# Patient Record
Sex: Female | Born: 1945 | Race: Black or African American | Hispanic: No | Marital: Married | Smoking: Never smoker
Health system: Southern US, Community
[De-identification: ages and names within clinical notes are randomized; demographics above are authoritative.]

## PROBLEM LIST (undated history)

## (undated) DIAGNOSIS — M8580 Other specified disorders of bone density and structure, unspecified site: Secondary | ICD-10-CM

## (undated) DIAGNOSIS — I10 Essential (primary) hypertension: Secondary | ICD-10-CM

## (undated) DIAGNOSIS — R739 Hyperglycemia, unspecified: Secondary | ICD-10-CM

## (undated) DIAGNOSIS — K5730 Diverticulosis of large intestine without perforation or abscess without bleeding: Secondary | ICD-10-CM

## (undated) DIAGNOSIS — E785 Hyperlipidemia, unspecified: Secondary | ICD-10-CM

## (undated) DIAGNOSIS — D649 Anemia, unspecified: Secondary | ICD-10-CM

## (undated) DIAGNOSIS — H9319 Tinnitus, unspecified ear: Secondary | ICD-10-CM

## (undated) DIAGNOSIS — H269 Unspecified cataract: Secondary | ICD-10-CM

## (undated) DIAGNOSIS — T7840XA Allergy, unspecified, initial encounter: Secondary | ICD-10-CM

## (undated) HISTORY — DX: Essential (primary) hypertension: I10

## (undated) HISTORY — PX: CATARACT EXTRACTION, BILATERAL: SHX1313

## (undated) HISTORY — DX: Tinnitus, unspecified ear: H93.19

## (undated) HISTORY — DX: Hyperglycemia, unspecified: R73.9

## (undated) HISTORY — DX: Anemia, unspecified: D64.9

## (undated) HISTORY — DX: Other specified disorders of bone density and structure, unspecified site: M85.80

## (undated) HISTORY — PX: OOPHORECTOMY: SHX86

## (undated) HISTORY — DX: Unspecified cataract: H26.9

## (undated) HISTORY — DX: Diverticulosis of large intestine without perforation or abscess without bleeding: K57.30

## (undated) HISTORY — PX: COLONOSCOPY: SHX174

## (undated) HISTORY — DX: Allergy, unspecified, initial encounter: T78.40XA

## (undated) HISTORY — DX: Hyperlipidemia, unspecified: E78.5

---

## 1997-05-19 HISTORY — PX: ABDOMINAL HYSTERECTOMY: SHX81

## 1999-07-16 ENCOUNTER — Other Ambulatory Visit: Admission: RE | Admit: 1999-07-16 | Discharge: 1999-07-16 | Payer: Self-pay | Admitting: Family Medicine

## 2001-12-17 ENCOUNTER — Other Ambulatory Visit: Admission: RE | Admit: 2001-12-17 | Discharge: 2001-12-17 | Payer: Self-pay | Admitting: Family Medicine

## 2001-12-17 ENCOUNTER — Encounter: Payer: Self-pay | Admitting: Family Medicine

## 2001-12-17 LAB — CONVERTED CEMR LAB: Pap Smear: NORMAL

## 2003-02-12 ENCOUNTER — Encounter: Payer: Self-pay | Admitting: Family Medicine

## 2003-02-12 ENCOUNTER — Ambulatory Visit (HOSPITAL_COMMUNITY): Admission: RE | Admit: 2003-02-12 | Discharge: 2003-02-12 | Payer: Self-pay | Admitting: Family Medicine

## 2003-02-17 HISTORY — PX: CEREBRAL ANEURYSM REPAIR: SHX164

## 2004-03-05 ENCOUNTER — Ambulatory Visit: Payer: Self-pay | Admitting: Family Medicine

## 2004-06-03 ENCOUNTER — Ambulatory Visit: Payer: Self-pay | Admitting: Family Medicine

## 2005-03-10 ENCOUNTER — Ambulatory Visit: Payer: Self-pay | Admitting: Family Medicine

## 2005-08-03 ENCOUNTER — Encounter: Payer: Self-pay | Admitting: Family Medicine

## 2005-08-08 ENCOUNTER — Ambulatory Visit: Payer: Self-pay | Admitting: Family Medicine

## 2005-08-19 ENCOUNTER — Ambulatory Visit: Payer: Self-pay | Admitting: Family Medicine

## 2006-04-07 ENCOUNTER — Ambulatory Visit: Payer: Self-pay | Admitting: Family Medicine

## 2006-10-22 ENCOUNTER — Encounter (INDEPENDENT_AMBULATORY_CARE_PROVIDER_SITE_OTHER): Payer: Self-pay | Admitting: Internal Medicine

## 2006-10-22 ENCOUNTER — Ambulatory Visit: Payer: Self-pay | Admitting: Family Medicine

## 2006-10-27 LAB — CONVERTED CEMR LAB
CO2: 30 meq/L (ref 19–32)
Chloride: 104 meq/L (ref 96–112)
Creatinine, Ser: 0.7 mg/dL (ref 0.4–1.2)
Potassium: 4.1 meq/L (ref 3.5–5.1)
Sodium: 140 meq/L (ref 135–145)

## 2006-10-28 ENCOUNTER — Encounter: Payer: Self-pay | Admitting: Family Medicine

## 2006-10-28 DIAGNOSIS — H9319 Tinnitus, unspecified ear: Secondary | ICD-10-CM | POA: Insufficient documentation

## 2006-10-28 DIAGNOSIS — K5730 Diverticulosis of large intestine without perforation or abscess without bleeding: Secondary | ICD-10-CM | POA: Insufficient documentation

## 2006-10-30 ENCOUNTER — Ambulatory Visit: Payer: Self-pay | Admitting: Family Medicine

## 2006-10-30 DIAGNOSIS — F411 Generalized anxiety disorder: Secondary | ICD-10-CM | POA: Insufficient documentation

## 2006-12-04 ENCOUNTER — Ambulatory Visit: Payer: Self-pay | Admitting: Family Medicine

## 2007-02-01 ENCOUNTER — Ambulatory Visit: Payer: Self-pay | Admitting: Family Medicine

## 2007-03-09 ENCOUNTER — Telehealth (INDEPENDENT_AMBULATORY_CARE_PROVIDER_SITE_OTHER): Payer: Self-pay | Admitting: *Deleted

## 2007-04-08 ENCOUNTER — Ambulatory Visit: Payer: Self-pay | Admitting: Family Medicine

## 2007-04-08 ENCOUNTER — Encounter: Payer: Self-pay | Admitting: Family Medicine

## 2007-04-13 ENCOUNTER — Encounter (INDEPENDENT_AMBULATORY_CARE_PROVIDER_SITE_OTHER): Payer: Self-pay | Admitting: *Deleted

## 2007-10-01 ENCOUNTER — Ambulatory Visit: Payer: Self-pay | Admitting: Family Medicine

## 2007-10-01 ENCOUNTER — Encounter: Payer: Self-pay | Admitting: Family Medicine

## 2007-10-01 ENCOUNTER — Other Ambulatory Visit: Admission: RE | Admit: 2007-10-01 | Discharge: 2007-10-01 | Payer: Self-pay | Admitting: Family Medicine

## 2007-10-01 DIAGNOSIS — N951 Menopausal and female climacteric states: Secondary | ICD-10-CM | POA: Insufficient documentation

## 2007-10-01 LAB — CONVERTED CEMR LAB: KOH Prep: NEGATIVE

## 2007-10-04 LAB — CONVERTED CEMR LAB
Albumin: 4.4 g/dL (ref 3.5–5.2)
Alkaline Phosphatase: 80 units/L (ref 39–117)
BUN: 12 mg/dL (ref 6–23)
Basophils Absolute: 0 10*3/uL (ref 0.0–0.1)
Basophils Relative: 0 % (ref 0–1)
Calcium: 9.1 mg/dL (ref 8.4–10.5)
Cholesterol: 207 mg/dL — ABNORMAL HIGH (ref 0–200)
Eosinophils Absolute: 0.1 10*3/uL (ref 0.0–0.7)
HDL: 54 mg/dL (ref >39)
Hemoglobin: 12.6 g/dL (ref 12.0–15.0)
Lymphocytes Relative: 29 % (ref 12–46)
Lymphs Abs: 2.1 10*3/uL (ref 0.7–4.0)
MCHC: 32.3 g/dL (ref 30.0–36.0)
MCV: 81.4 fL (ref 78.0–100.0)
Monocytes Absolute: 0.5 10*3/uL (ref 0.1–1.0)
Neutro Abs: 4.4 10*3/uL (ref 1.7–7.7)
Potassium: 4.8 meq/L (ref 3.5–5.3)
RBC: 4.79 M/uL (ref 3.87–5.11)
Total Bilirubin: 0.4 mg/dL (ref 0.3–1.2)
VLDL: 26 mg/dL (ref 0–40)
WBC: 7.1 10*3/uL (ref 4.0–10.5)

## 2007-10-08 ENCOUNTER — Encounter (INDEPENDENT_AMBULATORY_CARE_PROVIDER_SITE_OTHER): Payer: Self-pay | Admitting: *Deleted

## 2007-10-20 ENCOUNTER — Ambulatory Visit: Payer: Self-pay | Admitting: Gastroenterology

## 2007-11-03 ENCOUNTER — Ambulatory Visit: Payer: Self-pay | Admitting: Gastroenterology

## 2007-11-03 LAB — HM COLONOSCOPY

## 2008-04-04 ENCOUNTER — Ambulatory Visit: Payer: Self-pay | Admitting: Family Medicine

## 2008-04-04 LAB — CONVERTED CEMR LAB: Anti Nuclear Antibody(ANA): NEGATIVE

## 2008-04-05 LAB — CONVERTED CEMR LAB
Rhuematoid fact SerPl-aCnc: <20 intl units/mL — ABNORMAL LOW (ref 0.0–20.0)
Sed Rate: 24 mm/hr — ABNORMAL HIGH (ref 0–22)

## 2008-04-11 ENCOUNTER — Ambulatory Visit: Payer: Self-pay | Admitting: Family Medicine

## 2008-04-11 ENCOUNTER — Encounter: Payer: Self-pay | Admitting: Family Medicine

## 2008-11-10 ENCOUNTER — Ambulatory Visit: Payer: Self-pay | Admitting: Family Medicine

## 2009-02-15 ENCOUNTER — Ambulatory Visit: Payer: Self-pay | Admitting: Family Medicine

## 2009-02-15 DIAGNOSIS — J309 Allergic rhinitis, unspecified: Secondary | ICD-10-CM

## 2009-04-29 ENCOUNTER — Encounter: Payer: Self-pay | Admitting: Family Medicine

## 2009-04-30 ENCOUNTER — Ambulatory Visit: Payer: Self-pay | Admitting: Family Medicine

## 2009-04-30 DIAGNOSIS — Z780 Asymptomatic menopausal state: Secondary | ICD-10-CM | POA: Insufficient documentation

## 2009-05-01 LAB — CONVERTED CEMR LAB
ALT: 12 units/L (ref 0–35)
Albumin: 3.8 g/dL (ref 3.5–5.2)
Basophils Relative: 0.8 % (ref 0.0–3.0)
Bilirubin, Direct: 0 mg/dL (ref 0.0–0.3)
CO2: 29 meq/L (ref 19–32)
Calcium: 9.1 mg/dL (ref 8.4–10.5)
Eosinophils Relative: 1.7 % (ref 0.0–5.0)
HCT: 36.9 % (ref 36.0–46.0)
Hemoglobin: 12.2 g/dL (ref 12.0–15.0)
Lymphs Abs: 1.5 10*3/uL (ref 0.7–4.0)
MCV: 81.8 fL (ref 78.0–100.0)
Microalb, Ur: 1.4 mg/dL (ref 0.0–1.9)
Monocytes Absolute: 0.4 10*3/uL (ref 0.1–1.0)
Neutro Abs: 2.7 10*3/uL (ref 1.4–7.7)
Neutrophils Relative %: 57.5 % (ref 43.0–77.0)
Potassium: 4.1 meq/L (ref 3.5–5.1)
RBC: 4.51 M/uL (ref 3.87–5.11)
Sodium: 144 meq/L (ref 135–145)
TSH: 0.79 microintl units/mL (ref 0.35–5.50)
Total Protein: 7 g/dL (ref 6.0–8.3)
WBC: 4.7 10*3/uL (ref 4.5–10.5)

## 2009-06-06 ENCOUNTER — Ambulatory Visit: Payer: Self-pay | Admitting: Family Medicine

## 2009-06-06 ENCOUNTER — Encounter: Payer: Self-pay | Admitting: Family Medicine

## 2009-06-13 ENCOUNTER — Encounter (INDEPENDENT_AMBULATORY_CARE_PROVIDER_SITE_OTHER): Payer: Self-pay | Admitting: *Deleted

## 2009-06-13 LAB — HM MAMMOGRAPHY: HM Mammogram: NORMAL

## 2009-07-31 ENCOUNTER — Encounter (INDEPENDENT_AMBULATORY_CARE_PROVIDER_SITE_OTHER): Payer: Self-pay | Admitting: *Deleted

## 2009-08-07 ENCOUNTER — Ambulatory Visit: Payer: Self-pay | Admitting: Family Medicine

## 2009-08-07 DIAGNOSIS — E7800 Pure hypercholesterolemia, unspecified: Secondary | ICD-10-CM | POA: Insufficient documentation

## 2009-08-07 LAB — CONVERTED CEMR LAB
AST: 19 units/L (ref 0–37)
Cholesterol: 191 mg/dL (ref 0–200)
LDL Cholesterol: 109 mg/dL — ABNORMAL HIGH (ref 0–99)
VLDL: 28 mg/dL (ref 0.0–40.0)

## 2009-08-08 ENCOUNTER — Ambulatory Visit: Payer: Self-pay | Admitting: Family Medicine

## 2010-01-30 ENCOUNTER — Ambulatory Visit: Payer: Self-pay | Admitting: Family Medicine

## 2010-01-30 LAB — CONVERTED CEMR LAB
ALT: 16 units/L (ref 0–35)
AST: 22 units/L (ref 0–37)
LDL Cholesterol: 103 mg/dL — ABNORMAL HIGH (ref 0–99)
VLDL: 14.8 mg/dL (ref 0.0–40.0)

## 2010-02-06 ENCOUNTER — Ambulatory Visit: Payer: Self-pay | Admitting: Family Medicine

## 2010-02-06 DIAGNOSIS — R7303 Prediabetes: Secondary | ICD-10-CM

## 2010-05-01 ENCOUNTER — Ambulatory Visit: Payer: Self-pay | Admitting: Family Medicine

## 2010-05-04 LAB — CONVERTED CEMR LAB
ALT: 15 units/L (ref 0–35)
AST: 18 units/L (ref 0–37)
Albumin: 3.7 g/dL (ref 3.5–5.2)
BUN: 13 mg/dL (ref 6–23)
CO2: 30 meq/L (ref 19–32)
Calcium: 9.3 mg/dL (ref 8.4–10.5)
Chloride: 108 meq/L (ref 96–112)
Cholesterol: 200 mg/dL (ref 0–200)
Creatinine, Ser: 0.8 mg/dL (ref 0.4–1.2)
GFR calc non Af Amer: 94.9 mL/min (ref >60.00)
Glucose, Bld: 91 mg/dL (ref 70–99)
HDL: 50.9 mg/dL (ref >39.00)
Hgb A1c MFr Bld: 6.2 % (ref 4.6–6.5)
LDL Cholesterol: 126 mg/dL — ABNORMAL HIGH (ref 0–99)
Phosphorus: 3.6 mg/dL (ref 2.3–4.6)
Potassium: 4.1 meq/L (ref 3.5–5.1)
Sodium: 143 meq/L (ref 135–145)
Total CHOL/HDL Ratio: 4
Triglycerides: 115 mg/dL (ref 0.0–149.0)
VLDL: 23 mg/dL (ref 0.0–40.0)

## 2010-05-08 ENCOUNTER — Ambulatory Visit: Payer: Self-pay | Admitting: Family Medicine

## 2010-06-07 ENCOUNTER — Encounter: Payer: Self-pay | Admitting: Family Medicine

## 2010-06-07 ENCOUNTER — Ambulatory Visit: Payer: Self-pay | Admitting: Family Medicine

## 2010-06-10 ENCOUNTER — Ambulatory Visit
Admission: RE | Admit: 2010-06-10 | Discharge: 2010-06-10 | Payer: Self-pay | Source: Home / Self Care | Attending: Family Medicine | Admitting: Family Medicine

## 2010-06-10 ENCOUNTER — Other Ambulatory Visit: Payer: Self-pay | Admitting: Family Medicine

## 2010-06-10 LAB — LIPID PANEL
Cholesterol: 139 mg/dL (ref 0–200)
HDL: 51.9 mg/dL (ref >39.00)
Triglycerides: 84 mg/dL (ref 0.0–149.0)

## 2010-06-10 LAB — ALT: ALT: 18 U/L (ref 0–35)

## 2010-06-10 LAB — AST: AST: 22 U/L (ref 0–37)

## 2010-06-11 ENCOUNTER — Encounter: Payer: Self-pay | Admitting: Family Medicine

## 2010-06-17 ENCOUNTER — Ambulatory Visit: Payer: Self-pay | Admitting: Family Medicine

## 2010-06-17 ENCOUNTER — Encounter: Payer: Self-pay | Admitting: Family Medicine

## 2010-06-17 DIAGNOSIS — N63 Unspecified lump in unspecified breast: Secondary | ICD-10-CM | POA: Insufficient documentation

## 2010-06-18 NOTE — Miscellaneous (Signed)
Summary: mammogram results  Clinical Lists Changes  Observations: Added new observation of MAMMO DUE: 06/2010 (06/13/2009 8:09) Added new observation of MAMMOGRAM: normal (06/13/2009 8:09)      Preventive Care Screening  Mammogram:    Date:  06/13/2009    Next Due:  06/2010    Results:  normal

## 2010-06-18 NOTE — Assessment & Plan Note (Signed)
Summary: ROA FOR 6 MONTH FOLLOW-UP/JRR   Vital Signs:  Patient profile:   65 year old female Weight:      167.4 pounds Temp:     97.8 degrees F oral Pulse rate:   64 / minute Pulse rhythm:   regular BP sitting:   112 / 68  (left arm) Cuff size:   large  Vitals Entered By: Sydell Axon LPN (February 06, 2010 8:06 AM) CC: 6 Month follow-up   History of Present Illness: here for f/u of high cholesterol and hyperglycemia  wt is down 3 lb  cholesterol is fairly stable with trig of 74 and HDL 48 and LDL 103 (down from 109 and trig down from 140) was put on 1/2 zocor  not much different  has not been eating the best   started going to [employer] and going 5 days per week  wt is coming down very slowly  loves it  this helps her stress a bit too -- feels less wound up   stress level is down a little   does not get flu shots  flu shot made her sick  she does visit nursing homes   feet swelling at time        Allergies: 1)  ! 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) 6/09 colonoscopy- hemorrhoids   Family History: Last updated: 10/01/2007 father  DM Mother: CHF, HTN, DM Siblings:  brother DM, CVA and MI--at 45  Social History: Last updated: 02/06/2010 Marital Status: Married Children: 2 sons Occupation: Social research officer, government-- retired  much stress- husband [name]) has schitzoaffective disorder goes to [employer] for exercise  Risk Factors: Smoking Status: never (10/22/2006)  Past Medical History: hyperglycemia  Diverticulosis, colon inc intercular pressure osteopenia  hyperlipidemia     opthy -- Dr Orson Slick  Social History: Marital Status: Married Children: 2 sons Occupation: Social research officer, government-- retired  much stress- husband [name]) has schitzoaffective  disorder goes to [employer] for exercise  Review of Systems General:  Denies fatigue, loss of appetite, and malaise. Eyes:  Denies blurring and eye irritation. CV:  Complains of swelling of feet; denies chest pain or discomfort, lightheadness, palpitations, and shortness of breath with exertion. Resp:  Denies cough, shortness of breath, and wheezing. GI:  Denies abdominal pain, change in bowel habits, and indigestion. GU:  Denies dysuria and urinary frequency. MS:  Denies joint pain, muscle aches, and cramps. Derm:  Denies itching, lesion(s), poor wound healing, and rash. Neuro:  Denies numbness and tingling. Psych:  Denies anxiety and depression. Endo:  Denies cold intolerance, excessive thirst, excessive urination, and heat intolerance. Heme:  Denies abnormal bruising and bleeding.  Physical Exam  General:  overweight but generally well appearing  Head:  normocephalic, atraumatic, and no abnormalities observed.   Eyes:  vision grossly intact, pupils equal, pupils round, and pupils reactive to light.  no conjunctival pallor, injection or icterus  Mouth:  pharynx pink and moist.   Neck:  supple with full rom and no masses or thyromegally, no JVD or carotid bruit  Lungs:  Normal respiratory effort, chest expands symmetrically. Lungs are clear to auscultation, no crackles or wheezes. Heart:  Normal rate and regular rhythm. S1 and S2 normal without gallop, murmur, click, rub or other extra sounds. Abdomen:  Bowel sounds positive,abdomen soft and non-tender without masses, organomegaly or hernias noted.  no renal bruits  Pulses:  R and L carotid,radial,femoral,dorsalis pedis and posterior tibial pulses are full and equal bilaterally Extremities:  No clubbing, cyanosis, edema, or deformity noted with normal full range of motion of all joints.   Neurologic:  sensation intact to light touch, gait normal, and DTRs symmetrical and normal.   Skin:  Intact without suspicious lesions or rashes Cervical  Nodes:  No lymphadenopathy noted Psych:  normal affect, talkative and pleasant   Diabetes Management Exam:    Foot Exam (with socks and/or shoes not present):       Sensory-Pinprick/Light touch:          Left medial foot (L-4): normal          Left dorsal foot (L-5): normal          Left lateral foot (S-1): normal          Right medial foot (L-4): normal          Right dorsal foot (L-5): normal          Right lateral foot (S-1): normal       Sensory-Monofilament:          Left foot: normal          Right foot: normal       Inspection:          Left foot: normal          Right foot: normal       Nails:          Left foot: normal          Right foot: normal   Impression & Recommendations:  Problem # 1:  HYPERCHOLESTEROLEMIA (ICD-272.0) Assessment Unchanged  this did not change with low dose zocor  will hold it for now and we disc low sat fat diet in detail lab and f/u 3 mo -- if not staying at goal will disc going up on dose or trying something different  The following medications were removed from the medication list:    Zocor 20 Mg Tabs (Simvastatin) .Marland Kitchen... 1/2 by mouth once daily in evening  Labs Reviewed: SGOT: 22 (01/30/2010)   SGPT: 16 (01/30/2010)   HDL:48.40 (01/30/2010), 54.30 (08/07/2009)  LDL:103 (01/30/2010), 109 (08/07/2009)  Chol:166 (01/30/2010), 191 (08/07/2009)  Trig:74.0 (01/30/2010), 140.0 (08/07/2009)  Problem # 2:  HYPERGLYCEMIA (ICD-790.29) Assessment: Unchanged  last AIC 6  pt is making some effort with diet but still eating too many sweets disc low glycemic diet in detail  urged to keep working on wt loss and great exercise I recommended flu shot and pt declined it  check AIC 3 mo and then f/u  Labs Reviewed: Creat: 0.7 (04/30/2009)     Last Eye Exam: normal (04/18/2009)  Complete Medication List: 1)  Claritin 10 Mg Tabs (Loratadine) .Marland Kitchen.. 1-2 once daily as needed congestion 2)  Travatan Z 0.004 % Soln (Travoprost) .... Use one drop in left  eye at bedtime 3)  Aspir-low 81 Mg Tbec (Aspirin) .Marland Kitchen.. 1 by mouth once daily  Patient Instructions: 1)  please ask your husband not to eat junk food in front of you  2)  do not fall into his trap of eating  3)  please stick with low sugar and low fat diet  4)  keep up the great exercise  5)  stop the zocor for now - it is not really working  6)  schedule fasting labs in 3 months lipid/ast/alt / renal and AIC 250.0 and 272  and then follow up   Current Allergies (reviewed today): ! * PEXEVA

## 2010-06-18 NOTE — Assessment & Plan Note (Signed)
Summary: 3 MONTH FOLLOW UP-DISCUSS LABS/lb   Vital Signs:  Patient profile:   65 year old female Height:      63.25 inches Weight:      170 pounds BMI:     29.98 Temp:     98.3 degrees F oral Pulse rate:   76 / minute Pulse rhythm:   regular BP sitting:   114 / 72  (left arm) Cuff size:   regular  Vitals Entered By: Lewanda Rife LPN (August 08, 2009 10:29 AM) CC: three month follow up to discuss labs   History of Present Illness: here for f/u of lipids  recent lab showed imp with trig 140/ HDL 54 and LDL 109-- this is down from 130s   has been working on diet  has been eating oatmeal -- and lots of vegetables trying to stay away from fatty foods  eating mainly chicken and fish  not often bkfast meats or eggs  high fiber cereal - with lactaid low fat   not quite at goal but improved  high chol does run in family   is trying to exercise -signed up for [employer]  still really stressed out     bp is great   Allergies: 1)  ! 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 Eyes:  Denies blurring and eye irritation. CV:  Denies chest pain or discomfort, lightheadness, and palpitations. Resp:  Denies cough, shortness of breath, and wheezing. GI:  Denies abdominal pain, bloody stools, change in bowel habits, and indigestion. MS:  Denies muscle aches and muscle weakness. Derm:  Denies itching, lesion(s), poor wound healing, and rash. Neuro:  Denies numbness and tingling. Psych:  Denies anxiety and depression. Endo:  Denies excessive thirst and excessive urination. Heme:  Denies abnormal bruising and bleeding.  Physical Exam  General:  overweight but generally well appearing  Head:  normocephalic, atraumatic, and no abnormalities observed.   Eyes:  vision grossly intact, pupils equal, pupils round, and pupils reactive to light.  no conjunctival pallor, injection or icterus  Neck:  supple with full rom and no masses or thyromegally, no JVD or carotid bruit  Lungs:  Normal respiratory effort, chest expands symmetrically. Lungs are clear to auscultation, no crackles or wheezes. Heart:  Normal rate and regular rhythm. S1 and S2 normal without gallop, murmur, click, rub or other extra sounds. Pulses:  R and L carotid,radial,femoral,dorsalis pedis and posterior tibial pulses are full and equal bilaterally Extremities:  No clubbing, cyanosis, edema, or deformity noted with normal full range of motion of all joints.   Neurologic:  sensation intact to light touch, gait normal, and DTRs symmetrical and normal.   Skin:  Intact without  suspicious lesions or rashes Cervical Nodes:  No lymphadenopathy noted Psych:  normal affect, talkative and pleasant   Diabetes Management Exam:    Foot Exam (with socks and/or shoes not present):       Sensory-Pinprick/Light touch:          Left medial foot (L-4): normal          Left dorsal foot (L-5): normal          Left lateral foot (S-1): normal          Right medial foot (L-4):  normal          Right dorsal foot (L-5): normal          Right lateral foot (S-1): normal       Sensory-Monofilament:          Left foot: normal          Right foot: normal       Inspection:          Left foot: normal          Right foot: normal       Nails:          Left foot: normal          Right foot: normal   Impression & Recommendations:  Problem # 1:  HYPERCHOLESTEROLEMIA (ICD-272.0) Assessment Improved  improved with better diet but not at goal for diabetic  will start low dose zocor and re check 6 wk disc poss side eff urged to keep up good diet f/u 6 mo  Her updated medication list for this problem includes:    Zocor 20 Mg Tabs (Simvastatin) .Marland Kitchen... 1/2 by mouth once daily in evening  Labs Reviewed: SGOT: 19 (08/07/2009)   SGPT: 13 (08/07/2009)   HDL:54.30 (08/07/2009), 50.90 (04/30/2009)  LDL:109 (08/07/2009), 127 (10/01/2007)  Chol:191 (08/07/2009), 203 (04/30/2009)  Trig:140.0 (08/07/2009), 88.0 (04/30/2009)  Orders: Prescription Created Electronically [phone])  Complete Medication List: 1)  Claritin 10 Mg Tabs (Loratadine) .Marland Kitchen.. 1-2 once daily as needed congestion 2)  Travatan Z 0.004 % Soln (Travoprost) .... Use one drop in left eye at bedtime 3)  Aspir-low 81 Mg Tbec (Aspirin) .Marland Kitchen.. 1 by mouth once daily 4)  Zocor 20 Mg Tabs (Simvastatin) .... 1/2 by mouth once daily in evening  Patient Instructions: 1)  start zocor 1/2 pill daily in evening 2)  keep up the great low sat fat diet  3)  schedule fasting labs in 6 weeks lipid/ast/alt 272  4)  follow up with me in 6 months  Prescriptions: ZOCOR 20 MG TABS (SIMVASTATIN) 1/2 by mouth once daily in evening  #15 x 11   Entered and Authorized by:   Judith Part MD   Signed by:   Judith Part MD on 08/08/2009   Method used:   [facility] to        [facility]  [address] (retail)       [address]       [place], [place]  [address]       Ph: [phone] or [phone]       Fax: [phone]    RxID:   [id number]   Current Allergies (reviewed today): ! * PEXEVA

## 2010-06-18 NOTE — Letter (Signed)
Summary: [facility] No Show Letter  [place] [facility]  [address] [place], [place] [address]   Phone: [phone]  Fax: [phone]    07/31/2009 MRN: [id number]  Sue Conway [address] [place], [place]  [address]   Dear Ms. Smeltz,   Our records indicate that you missed your scheduled appointment with ____lab_________________ on __3.15.11__________.  Please contact this office to reschedule your appointment as soon as possible.  It is important that you keep your scheduled appointments with your physician, so we can provide you the best care possible.  Please be advised that there may be a charge for "no show" appointments.    Sincerely,   [place] [facility]

## 2010-06-19 HISTORY — PX: FINE NEEDLE ASPIRATION: SHX406

## 2010-06-20 NOTE — Assessment & Plan Note (Signed)
Summary: FOLLOW UP AFTER LABS/RI   Vital Signs:  Patient profile:   65 year old female Height:      63.25 inches Weight:      163.25 pounds BMI:     28.79 Temp:     98.2 degrees F oral Pulse rate:   64 / minute Pulse rhythm:   regular BP sitting:   118 / 72  (left arm) Cuff size:   large  Vitals Entered By: Lewanda Rife LPN (May 08, 2010 8:57 AM) CC: follow-up visit after labs   History of Present Illness: here for f/u of lipids and hyperglycemia - both diet controlled   has been feeling pretty good overall no medical changes  is trying to take care of herself   wt is down 4 lb-- is good news  diet-- is eating less and more high fiber foods and veg  is getting some exercise -- goes to [employer]   lipids are up off zocor with trig 115 and HDL 50 and LDL 126 up from 103  AIC is 6.2 up from 6  needs calcium and vit D for bones  tried [facility] -- and had side eff?     Allergies: 1)  ! 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 Eyes:  Denies blurring and eye irritation. CV:  Denies chest pain or discomfort,  lightheadness, palpitations, and shortness of breath with exertion. Resp:  Denies cough, shortness of breath, and wheezing. GI:  Denies abdominal pain, bloody stools, change in bowel habits, indigestion, and nausea. GU:  Denies dysuria and urinary hesitancy. MS:  Denies muscle aches and cramps. Derm:  Denies itching, lesion(s), poor wound healing, and rash. Neuro:  Denies numbness and tingling. Psych:  mood is ok . Endo:  Denies cold intolerance, excessive thirst, excessive urination, and heat intolerance. Heme:  Denies abnormal bruising and bleeding.  Physical Exam  General:  overweight but generally well appearing  Head:  normocephalic, atraumatic, and no abnormalities observed.   Eyes:  vision grossly intact, pupils equal, pupils round, and pupils reactive to light.  no conjunctival pallor, injection or icterus  Mouth:  pharynx pink and moist.   Neck:  supple with full rom and no masses or thyromegally, no JVD or carotid bruit  Chest Wall:  No deformities, masses, or tenderness noted. Lungs:  Normal respiratory effort, chest expands symmetrically. Lungs are clear to auscultation, no crackles or wheezes. Heart:  Normal rate and regular rhythm. S1 and S2 normal without gallop, murmur, click, rub or other extra sounds. Abdomen:  Bowel sounds positive,abdomen soft and non-tender without masses, organomegaly or hernias noted. no renal bruits  Msk:  No deformity or scoliosis noted of thoracic or lumbar spine.  no acute joint changes  nl rom L arm- no tenderness  Extremities:  No clubbing, cyanosis, edema, or deformity noted with normal full range of motion of all joints.   Neurologic:  sensation intact to pinprick, gait normal, and DTRs symmetrical and normal.   Skin:  Intact without suspicious lesions or rashes Cervical Nodes:  No lymphadenopathy noted Inguinal Nodes:  No significant adenopathy Psych:  normal affect, talkative and pleasant    Impression & Recommendations:  Problem #  1:  HYPERCHOLESTEROLEMIA (ICD-272.0) Assessment Deteriorated  this is up off zocor  pt is worried about side eff given fam hx and hyperglycemia my goal for LDL is 100 or less and diet is already great rev low sat fat diet  trial of lipitor and update lab 6 wk Her updated medication list for this problem includes:    Lipitor 10 Mg Tabs (Atorvastatin calcium) .Marland Kitchen... 1 by mouth once daily in evening  Labs Reviewed: SGOT: 18 (05/01/2010)   SGPT: 15 (05/01/2010)   HDL:50.90 (05/01/2010), 48.40 (01/30/2010)  LDL:126 (05/01/2010), 103 (01/30/2010)  Chol:200 (05/01/2010), 166 (01/30/2010)  Trig:115.0 (05/01/2010), 74.0 (01/30/2010)  Orders: Prescription Created Electronically [phone])  Problem # 2:  HYPERGLYCEMIA (ICD-790.29) Assessment: Deteriorated  this is stable to slt worse disc avoiding simple sugars and carbs  enc to get back to [facility] work on wt loss risk for DM is high given fam hx  plan on PE / labs in 6 mo  Labs Reviewed: Creat: 0.8 (05/01/2010)     Last Eye Exam: normal (04/18/2009)  Problem # 3:  OTHER SCREENING MAMMOGRAM (ICD-V76.12) Assessment: Comment Only schedule annual mam due next mo  f/u PE in 6 mo Orders: Radiology Referral (Radiology)  Complete Medication List: 1)  Claritin 10 Mg Tabs (Loratadine) .Marland Kitchen.. 1-2 once daily as needed congestion 2)  Lipitor 10 Mg Tabs (Atorvastatin calcium) .Marland Kitchen.. 1 by mouth once daily in evening  Patient Instructions: 1)  try viactiv (or other generic version)-- candy chewables for calcium and vitamin D  2)  the current recommendation for calcium intake is 1200-1500 mg daily with [phone] IU of vitamin D  3)  keep going to [facility] 4)  try generic lipitor -- low dose 5)  if any side effects or problems- stop med and let me know  6)  keep up the good work with healthy diet  7)  fasting labs for 6 weeks lipid/ast/alt 272  8)  schedule PE in 6 months please 9)  we will schedule mammogram at check out  Prescriptions: LIPITOR 10  MG TABS (ATORVASTATIN CALCIUM) 1 by mouth once daily in evening  #30 x 11   Entered and Authorized by:   Judith Part MD   Signed by:   Judith Part MD on 05/08/2010   Method used:   [facility]        [facility] ([facility]ail)       [address]       [place], [place]  [address]       Ph: [phone] or [phone]       Fax: [phone]   RxID:   [id number]    Orders Added: 1)  Radiology Referral [Radiology] 2)  Prescription Created Electronically [[id number]] 3)  Est. Patient Level IV [66440]    Current Allergies (reviewed today): ! * PEXEVA ASPIRIN

## 2010-06-21 ENCOUNTER — Ambulatory Visit: Payer: Self-pay

## 2010-06-21 ENCOUNTER — Ambulatory Visit: Admit: 2010-06-21 | Payer: Self-pay | Admitting: Family Medicine

## 2010-06-25 ENCOUNTER — Encounter: Payer: Self-pay | Admitting: Family Medicine

## 2010-06-26 ENCOUNTER — Encounter: Payer: Self-pay | Admitting: Family Medicine

## 2010-07-16 NOTE — Letter (Signed)
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

## 2010-07-18 ENCOUNTER — Encounter: Payer: Self-pay | Admitting: Family Medicine

## 2010-07-25 NOTE — Letter (Signed)
Summary: [facility]   Imported By: Kassie Mends 07/19/2010 11:36:24  _____________________________________________________________________  External Attachment:    Type:   Image     Comment:   External Document

## 2010-08-06 NOTE — Letter (Signed)
[facility]   Imported By: Kassie Mends 07/31/2010 09:07:35  _____________________________________________________________________  External Attachment:    Type:   Image     Comment:   External Document

## 2010-08-09 ENCOUNTER — Ambulatory Visit (HOSPITAL_COMMUNITY): Payer: Self-pay | Admitting: Psychology

## 2010-09-14 ENCOUNTER — Encounter: Payer: Self-pay | Admitting: Family Medicine

## 2010-09-14 LAB — HM MAMMOGRAPHY

## 2010-09-27 ENCOUNTER — Encounter: Payer: Self-pay | Admitting: Family Medicine

## 2010-09-27 ENCOUNTER — Ambulatory Visit (INDEPENDENT_AMBULATORY_CARE_PROVIDER_SITE_OTHER): Payer: Medicare Other | Admitting: Family Medicine

## 2010-09-27 DIAGNOSIS — F411 Generalized anxiety disorder: Secondary | ICD-10-CM

## 2010-09-27 DIAGNOSIS — E7800 Pure hypercholesterolemia, unspecified: Secondary | ICD-10-CM

## 2010-09-27 DIAGNOSIS — J309 Allergic rhinitis, unspecified: Secondary | ICD-10-CM

## 2010-09-27 DIAGNOSIS — Z23 Encounter for immunization: Secondary | ICD-10-CM

## 2010-09-27 DIAGNOSIS — N63 Unspecified lump in unspecified breast: Secondary | ICD-10-CM

## 2010-09-27 DIAGNOSIS — R7309 Other abnormal glucose: Secondary | ICD-10-CM

## 2010-09-27 LAB — LIPID PANEL
Cholesterol: 155 mg/dL (ref 0–200)
LDL Cholesterol: 76 mg/dL (ref 0–99)
Triglycerides: 84 mg/dL (ref 0.0–149.0)

## 2010-09-27 LAB — COMPREHENSIVE METABOLIC PANEL
AST: 33 U/L (ref 0–37)
Alkaline Phosphatase: 80 U/L (ref 39–117)
BUN: 9 mg/dL (ref 6–23)
Calcium: 9.7 mg/dL (ref 8.4–10.5)
Chloride: 107 mEq/L (ref 96–112)
Creatinine, Ser: 0.8 mg/dL (ref 0.4–1.2)

## 2010-09-27 LAB — HEMOGLOBIN A1C: Hgb A1c MFr Bld: 6.2 % (ref 4.6–6.5)

## 2010-09-27 MED ORDER — PNEUMOCOCCAL VAC POLYVALENT 25 MCG/0.5ML IJ INJ: 0.5000 mL | INJECTION | Freq: Once | INTRAMUSCULAR | Status: DC

## 2010-09-27 MED ORDER — FLUTICASONE PROPIONATE 50 MCG/ACT NA SUSP: 2.0000 | Freq: Every day | NASAL | Status: DC

## 2010-09-27 NOTE — Assessment & Plan Note (Signed)
Recent neg needle bx from [facility] and will re check R breast in July  I felt no abn on exam today- reassuring

## 2010-09-27 NOTE — Assessment & Plan Note (Signed)
Pt has been loosing wt on the curves program and watching sugar in diet a1c today- expect stability or improvement Enc further wt loss Rev low glycemic diet

## 2010-09-27 NOTE — Patient Instructions (Signed)
Pneumonia vaccine today Try flonase nasal spray for congestion in nose and ear through allergy season Labs today If you are interested in shingles vaccine in future - call your insurance company to see how coverage is and call us to schedule Avoid red meat/ fried foods/ egg yolks/ fatty breakfast meats/ butter, cheese and high fat dairy/ and shellfish  Also watch sugar in your diet

## 2010-09-27 NOTE — Progress Notes (Signed)
Subjective:    Patient ID: Sue Conway, female    DOB: 23-Apr-1946, 65 y.o.   MRN: [id number]  HPI Here for check up of chronic medical problems and to review health mt list  Has been feeling good overall   Had something in her eye - grass seed- removed by opthy yesterday Nl exam the week before  Is much better now   Head and R ear are stopped up  Sinus congested  Nasal d/c is all clear  Has taken some claritin  No fever or sinus pain   Zoster status- never had it - ? Sure if interested in vaccine - depends on cost and ins coverage  Pneumovax- is interested in that  Td 03  colonosc 6/09 with hem- no fam hx -- not due yet   hyst in past for fibroids No symptoms or problems , no new partners  Has not felt any breast lumps on self exam   Lipids were good in jan with HDL 51 and LDL 70 on lipitor Lab Results  Component Value Date   CHOL 139 06/10/2010   CHOL 200 05/01/2010   CHOL 166 01/30/2010   Lab Results  Component Value Date   HDL 51.90 06/10/2010   HDL 29.56 05/01/2010   HDL 48.40 01/30/2010   Lab Results  Component Value Date   LDLCALC 70 06/10/2010   LDLCALC 126* 05/01/2010   LDLCALC 103* 01/30/2010   Lab Results  Component Value Date   TRIG 84.0 06/10/2010   TRIG 115.0 05/01/2010   TRIG 74.0 01/30/2010   Lab Results  Component Value Date   CHOLHDL 3 06/10/2010   CHOLHDL 4 05/01/2010   CHOLHDL 3 01/30/2010   Lab Results  Component Value Date   LDLDIRECT 133.8 04/30/2009     Hyperglycemia Lab Results  Component Value Date   HGBA1C 6.2 05/01/2010   mammogram showed abn on R side - went back for addn views -- and then saw a surgeon -- had a breat bx and it was ok  Re check R breast in July - already schedule  ? A cyst   Past Medical History  Diagnosis Date  . Hyperlipidemia   . Elevated blood sugar level   . Diverticulosis of colon     BE mild diverticulosis 01/1995  . Osteopenia   . Ringing in ears     worse with aspirin  . Glaucoma 09/24/2005      history of glaucoma (Dr. Ranell Patrick)    History   Social History  . Marital Status: Married    Spouse Name: N/A    Number of Children: N/A  . Years of Education: N/A   Occupational History  . Not on file.   Social History Main Topics  . Smoking status: Never Smoker   . Smokeless tobacco: Not on file  . Alcohol Use: Not on file  . Drug Use: Not on file  . Sexually Active: Not on file   Other Topics Concern  . Not on file   Social History Narrative  . No narrative on file    Family History  Problem Relation Age of Onset  . Diabetes Mother   . Hypertension Mother   . Heart disease Mother     CHF  . Diabetes Father   . Diabetes Brother   . Heart disease Brother 75    CVA and MI    Allergies  Allergen Reactions  . Aspirin     REACTION: worsens her tinnitis  Review of Systems Review of Systems  Constitutional: Negative for fever, appetite change, fatigue and unexpected weight change.  Eyes: Negative for pain and visual disturbance.  ENT pos for congestion and runny nose  Respiratory: Negative for cough and shortness of breath.   Cardiovascular: Negative.   Gastrointestinal: Negative for nausea, diarrhea and constipation.  Genitourinary: Negative for urgency and frequency.  Skin: Negative for pallor.  Neurological: Negative for weakness, light-headedness, numbness and headaches.  Hematological: Negative for adenopathy. Does not bruise/bleed easily.  Psychiatric/Behavioral: Negative for dysphoric mood. The patient is not nervous/anxious.          Objective:   Physical Exam  Constitutional: She appears well-developed and well-nourished. No distress.       overwt and well app  HENT:  Head: Normocephalic and atraumatic.  Right Ear: External ear normal.  Left Ear: External ear normal.  Mouth/Throat: Oropharynx is clear and moist.       Nares are congested and boggy No sinus tenderness   Eyes: Conjunctivae and EOM are normal. Pupils are equal,  round, and reactive to light.  Neck: Normal range of motion. Neck supple. No JVD present. Carotid bruit is not present. No thyromegaly present.  Cardiovascular: Normal rate, regular rhythm and normal heart sounds.   Pulmonary/Chest: Effort normal and breath sounds normal. No respiratory distress. She has no wheezes.  Abdominal: Soft. Bowel sounds are normal. She exhibits no distension, no abdominal bruit and no mass. There is no tenderness.  Genitourinary: There is breast tenderness. No breast swelling, discharge or bleeding.       R breast is tender from recent bx  Overall no M or changes noted   Musculoskeletal: Normal range of motion. She exhibits no edema and no tenderness.  Lymphadenopathy:    She has no cervical adenopathy.  Neurological: She is alert. She has normal reflexes. Coordination normal.  Skin: Skin is warm and dry. No erythema. No pallor.  Psychiatric: She has a normal mood and affect.       Pt does seem generally stressed and tired           Assessment & Plan:

## 2010-09-27 NOTE — Assessment & Plan Note (Signed)
This has been very well controlled with lipitor- will continue that She will let me know if it becomes unaffordible  Disc low sat fat diet Rev lab with pt from [place]

## 2010-09-27 NOTE — Assessment & Plan Note (Signed)
Congestion is worse in pollen season and having ETD on R  Will try flonase Can continue claritin Avoid allergens when poss  Update if worse or no imp

## 2010-09-27 NOTE — Assessment & Plan Note (Signed)
Lots of stress caring for husband with mental illness She is going to a support group now - which is very helpful- encouraged that

## 2010-10-03 ENCOUNTER — Telehealth: Payer: Self-pay

## 2010-10-03 NOTE — Telephone Encounter (Signed)
Patient notified as instructed by telephone. Pt said she would call back later to schedule appt.

## 2010-10-03 NOTE — Telephone Encounter (Signed)
Message copied by Lewanda Rife on Thu Oct 03, 2010  4:42 PM ------      Message from: Roxy Manns      Created: Sun Sep 29, 2010  5:39 PM       Labs are ok       Cholesterol improved      Sugar control is stable      Keep working hard on diet and exercise      F/u with me about 6 months if not already planned

## 2010-12-19 ENCOUNTER — Ambulatory Visit: Payer: Self-pay | Admitting: General Surgery

## 2011-02-04 DIAGNOSIS — Z0279 Encounter for issue of other medical certificate: Secondary | ICD-10-CM

## 2011-03-17 ENCOUNTER — Ambulatory Visit (INDEPENDENT_AMBULATORY_CARE_PROVIDER_SITE_OTHER): Payer: Medicare Other | Admitting: Family Medicine

## 2011-03-17 ENCOUNTER — Encounter: Payer: Self-pay | Admitting: Family Medicine

## 2011-03-17 VITALS — BP 132/72 | HR 82 | Temp 98.3°F | Wt 165.0 lb

## 2011-03-17 DIAGNOSIS — J309 Allergic rhinitis, unspecified: Secondary | ICD-10-CM

## 2011-03-17 MED ORDER — FLUTICASONE PROPIONATE 50 MCG/ACT NA SUSP: NASAL | Status: DC

## 2011-03-17 NOTE — Assessment & Plan Note (Addendum)
Likely cause (and not purely infectious) with concurrent ETD. No indication for abx . Continue nasal saline and start back on flonase.  Restart routine meds and f/u prn.  Anatomy d/w pt.

## 2011-03-17 NOTE — Progress Notes (Signed)
duration of symptoms: several weeks, "some days are worse than others." Rhinorrhea: initially with URI sx, progressed to coughing Congestion:yes, L >R now, but will alternate.   ear pain:some sore throat: no, but some post nasal gtt Cough:yes, occ sputum myalgias:no other concerns: she could feel her pulse in ear, no fevers.   Prev taking aleve/pseudophed  She stopped all of her rx meds, w/o any change in sx.    H/o allergies and ETD.    ROS: See HPI.  Otherwise negative.    Meds, vitals, and allergies reviewed.   GEN: nad, alert and oriented HEENT: mucous membranes moist, TM w/o erythema, nasal epithelium injected, OP with cobblestoning, SOM noted, mild cerumen in canals- removed by MD and tolerated well, max and frontal sinus not ttp NECK: supple w/o LA CV: rrr. PULM: ctab, no inc wob Poor TM movement on valsalva

## 2011-03-17 NOTE — Patient Instructions (Signed)
Start back on your regular meds and use the flonase 2 sprays in each nostril once a day. Use nasal saline several times a day. Take care.

## 2011-05-09 ENCOUNTER — Other Ambulatory Visit: Payer: Self-pay | Admitting: Family Medicine

## 2011-05-09 NOTE — Telephone Encounter (Signed)
[facility] request refill Lipitor 10 mg #30 x 7.

## 2011-05-20 HISTORY — PX: BREAST BIOPSY: SHX20

## 2011-07-24 ENCOUNTER — Encounter: Payer: Self-pay | Admitting: Family Medicine

## 2011-07-24 ENCOUNTER — Ambulatory Visit: Payer: Medicare Other | Admitting: Family Medicine

## 2011-07-24 ENCOUNTER — Ambulatory Visit (INDEPENDENT_AMBULATORY_CARE_PROVIDER_SITE_OTHER): Payer: Medicare Other | Admitting: Family Medicine

## 2011-07-24 VITALS — BP 132/72 | HR 88 | Temp 97.9°F | Wt 162.0 lb

## 2011-07-24 DIAGNOSIS — R51 Headache: Secondary | ICD-10-CM

## 2011-07-24 MED ORDER — AMOXICILLIN-POT CLAVULANATE 875-125 MG PO TABS: 1.0000 | ORAL_TABLET | Freq: Two times a day (BID) | ORAL | Status: AC

## 2011-07-24 NOTE — Patient Instructions (Signed)
Take antibiotic as directed- Augmentin 1 tablet twice daily for 10 days.  Drink lots of fluids.  Treat sympotmatically with Mucinex, nasal saline irrigation, and Tylenol/Ibuprofen.  Call if not improving as expected in 5-7 days.  Then we can talk about Migraine/Headache treatment.

## 2011-07-24 NOTE — Progress Notes (Signed)
Subjective:    Patient ID: Sue Conway, female    DOB: May 10, 1946, 66 y.o.   MRN: [id number]  HPI  66 yo pt of Dr. Milinda Antis new to here for headaches- Ongoing for several months- come and go.  Has been under more stress lately, also has been congested for weeks with increased left sided sinus pressure. No cough , fever or SOB.  Denies any nausea or vomiting but often does have photophobia or visual changes with these headaches. No other focal neurological deficits.  Past Medical History  Diagnosis Date  . Hyperlipidemia   . Elevated blood sugar level   . Diverticulosis of colon     BE mild diverticulosis 01/1995  . Osteopenia   . Ringing in ears     worse with aspirin  . Glaucoma 09/24/2005    history of glaucoma (Dr. Ranell Patrick)    History   Social History  . Marital Status: Married    Spouse Name: N/A    Number of Children: N/A  . Years of Education: N/A   Occupational History  . Not on file.   Social History Main Topics  . Smoking status: Never Smoker   . Smokeless tobacco: Not on file  . Alcohol Use: Not on file  . Drug Use: Not on file  . Sexually Active: Not on file   Other Topics Concern  . Not on file   Social History Narrative  . No narrative on file    Family History  Problem Relation Age of Onset  . Diabetes Mother   . Hypertension Mother   . Heart disease Mother     CHF  . Diabetes Father   . Diabetes Brother   . Heart disease Brother 77    CVA and MI    Allergies  Allergen Reactions  . Aspirin     REACTION: worsens her tinnitis         Review of Systems See HPI.          Objective:   Physical Exam  BP 132/72  Pulse 88  Temp(Src) 97.9 F (36.6 C) (Oral)  Wt 162 lb (73.483 kg)  LMP 05/19/1993  Constitutional: She appears well-developed and well-nourished. No distress.       overwt and well app  HENT:  Head: Normocephalic and atraumatic.  Right Ear: External ear normal.  Left Ear: External ear normal.  Mouth/Throat:  Oropharynx is clear and moist.       Nares are congested and boggy Left frontal sinus TTP  Eyes: Conjunctivae and EOM are normal. Pupils are equal, round, and reactive to light.  Neck: Normal range of motion. Neck supple. No JVD present. Carotid bruit is not present. No thyromegaly present.  Cardiovascular: Normal rate, regular rhythm and normal heart sounds.   Pulmonary/Chest: Effort normal and breath sounds normal. No respiratory distress. She has no wheezes.  Musculoskeletal: Normal range of motion. She exhibits no edema and no tenderness.  Lymphadenopathy:    She has no cervical adenopathy.  Neurological: She is alert. She has normal reflexes. Coordination normal.  Skin: Skin is warm and dry. No erythema. No pallor.  Psychiatric: She has a normal mood and affect but appears fatigued.     Assessment & Plan:   1. HA (headache)    Deteriorated. Most likely multifactorial.  Continue Nasonex, does seem to have sinus infection at this point. Given duration and progression of symptoms, will treat for bacterial sinusitis with Augmentin. She likely also is experiencing migraines  and tension HA. Per pt report, cannot tolerate many pain medications.  Advised if symptoms do not improve, talk to PCP about her headaches. The patient indicates understanding of these issues and agrees with the plan.

## 2011-09-26 ENCOUNTER — Encounter: Payer: Self-pay | Admitting: Family Medicine

## 2011-09-26 ENCOUNTER — Ambulatory Visit (INDEPENDENT_AMBULATORY_CARE_PROVIDER_SITE_OTHER): Payer: Medicare Other | Admitting: Family Medicine

## 2011-09-26 DIAGNOSIS — R0600 Dyspnea, unspecified: Secondary | ICD-10-CM | POA: Insufficient documentation

## 2011-09-26 DIAGNOSIS — R079 Chest pain, unspecified: Secondary | ICD-10-CM | POA: Insufficient documentation

## 2011-09-26 DIAGNOSIS — R0989 Other specified symptoms and signs involving the circulatory and respiratory systems: Secondary | ICD-10-CM

## 2011-09-26 DIAGNOSIS — R51 Headache: Secondary | ICD-10-CM

## 2011-09-26 NOTE — Assessment & Plan Note (Signed)
Perhaps exertional, not sure  Pt is in tremendous stress and had to stop exercise  Will f/u cardiology Suspect deconditioned

## 2011-09-26 NOTE — Patient Instructions (Addendum)
I am going to review your chart and make a plan for headache - will let you know  I am wondering how much of a role anxiety plays  Hold the lipitor for 2 weeks and let me know if symptoms get better  We will do cardiology ref at check out  Update me if symptoms get worse

## 2011-09-26 NOTE — Assessment & Plan Note (Signed)
Acute on chronic  Will rev old records/ scans / neurology

## 2011-09-26 NOTE — Assessment & Plan Note (Signed)
Brief and fleeting - with atypical nature/ not exertional May be stress related  EKG shows occ PVC Would still be worth talking to cardiol- ? If with risk factors stress test would be helpful

## 2011-09-26 NOTE — Progress Notes (Signed)
Subjective:    Patient ID: Sue Conway, female    DOB: 24-May-1945, 66 y.o.   MRN: [id number]  HPI Here for several problems   Headache on L side - sharp over temple/ on and off  No nausea/ ? If sensitive to light  Also gets throbbing in head a lot  No hx of migraine   Cerebral aneurysm -- was in family - at first a question of one and then repeat fine -- that was 2004  She saw neuro/ neurosurg Mother died of that   Stress level is very high -- issues with her husband , going to counseling , he tells a lot of lies  Rough marriage -- and ? Needs to get out of that   Also having chest discomfort - L side and running down the back of her arm  Lasts 2-3 seconds - fleeting  Happens at different times - often when tired / not exertional   Also generally out of breath with exertion at times Used to go to [place] a [place] -- now does not  No chest pressure No nausea or sweating   Chronic swelling in feet - no change for years   Patient Active Problem List  Diagnoses  . HYPERCHOLESTEROLEMIA  . ANXIETY STATE NOS  . TINNITUS  . ALLERGIC RHINITIS  . DIVERTICULOSIS, COLON  . POSTMENOPAUSAL STATUS  . HYPERGLYCEMIA  . POSTMENOPAUSAL STATUS  . LUMP OR MASS IN BREAST  . HA (headache)   Past Medical History  Diagnosis Date  . Hyperlipidemia   . Elevated blood sugar level   . Diverticulosis of colon     BE mild diverticulosis 01/1995  . Osteopenia   . Ringing in ears     worse with aspirin  . Glaucoma 09/24/2005    history of glaucoma (Dr. Ranell Patrick)   Past Surgical History  Procedure Date  . Abdominal hysterectomy 1999    total hysterectomy fibroids  . 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 Fine needle aspiration 06/2010    breast   History  Substance Use Topics  . Smoking status: Never Smoker   . Smokeless tobacco: Not on file  . Alcohol Use: No   Family History  Problem Relation Age of Onset  . Diabetes Mother   . Hypertension Mother   . Heart disease Mother       CHF  . Diabetes Father   . Diabetes Brother   . Heart disease Brother 96    CVA and MI   Allergies  Allergen Reactions  . Aspirin     REACTION: worsens her tinnitis   Current Outpatient Prescriptions on File Prior to Visit  Medication Sig Dispense Refill  . atorvastatin (LIPITOR) 10 MG tablet TAKE 1 TABLET BY MOUTH ONCE DAILY IN THE EVENING  30 tablet  7  . Calcium Carbonate-Vit D-Min 600-400 MG-UNIT TABS Take 1 tablet by mouth 2 (two) times daily.        . fluticasone (FLONASE) 50 MCG/ACT nasal spray 2 sprays in each nostril daily.  16 g  11  . loratadine (CLARITIN) 10 MG tablet Take 10 mg by mouth daily. 1 -2 once daily as needed for congestion             Review of Systems Review of Systems  Constitutional: Negative for fever, appetite change, fatigue and unexpected weight change.  Eyes: Negative for pain and visual disturbance.  Respiratory: Negative for cough and pos for occ sob/ no wheeze Cardiovascular: Negative for  palpitations or PND or orthopnea , baseline foot swelling     Gastrointestinal: Negative for nausea, diarrhea and constipation.  Genitourinary: Negative for urgency and frequency.  Skin: Negative for pallor or rash   Neurological: Negative for weakness, light-headedness, numbness and headaches.  Hematological: Negative for adenopathy. Does not bruise/bleed easily.  Psychiatric/Behavioral:pos for dysphoric mood and anxiety over home situation        Objective:   Physical Exam  Constitutional: She appears well-developed and well-nourished. No distress.  HENT:  Head: Normocephalic and atraumatic.  Mouth/Throat: Oropharynx is clear and moist.  Eyes: Conjunctivae and EOM are normal. Pupils are equal, round, and reactive to light. No scleral icterus.  Neck: Normal range of motion. Neck supple. No JVD present. Carotid bruit is not present. No thyromegaly present.  Cardiovascular: Normal rate, regular rhythm, normal heart sounds and intact distal pulses.   Exam reveals no gallop.   Pulmonary/Chest: Effort normal and breath sounds normal. No respiratory distress. She has no wheezes. She has no rales.  Abdominal: Soft. Bowel sounds are normal. She exhibits no distension, no abdominal bruit and no mass. There is no tenderness.  Musculoskeletal: Normal range of motion. She exhibits no edema and no tenderness.  Lymphadenopathy:    She has no cervical adenopathy.  Neurological: She is alert. She has normal reflexes. No cranial nerve deficit. She exhibits normal muscle tone. Coordination normal.  Skin: Skin is warm and dry. No rash noted. No erythema. No pallor.  Psychiatric: She has a normal mood and affect.          Assessment & Plan:

## 2011-09-29 ENCOUNTER — Encounter: Payer: Self-pay | Admitting: Cardiovascular Disease

## 2011-09-29 ENCOUNTER — Ambulatory Visit (INDEPENDENT_AMBULATORY_CARE_PROVIDER_SITE_OTHER): Payer: Medicare Other | Admitting: Cardiovascular Disease

## 2011-09-29 DIAGNOSIS — R0600 Dyspnea, unspecified: Secondary | ICD-10-CM

## 2011-09-29 DIAGNOSIS — R079 Chest pain, unspecified: Secondary | ICD-10-CM

## 2011-09-29 NOTE — Progress Notes (Signed)
HPI  This is a 66 year old female who is referred by Dr. Milinda Antis for evaluation of chest pain and dyspnea. The patient has no previous cardiac history. She has no history of diabetes, hypertension or tobacco use. There is a strong family history of premature coronary artery disease. She also has history of hyperlipidemia. She describes her chest pain is substernal tightness radiating to her left arm. It lasts for about 10 minutes and resolves continuously. It happens at rest mostly when she is under significant stress and anxiety. She has been living in a very stressful situation related to a bad marriage. He does not exercise on a regular basis. She also noted recently episodes of dyspnea where she finds herself gasping for air. These episodes are very predictable. She denies any lower extremity swelling or edema. He has no previous history of DVT or pulmonary embolism. She is not aware of any previous cardiac history. She also complains of feeling her heartbeats in her ears especially at night.  Allergies  Allergen Reactions  . Aspirin     REACTION: worsens her tinnitis     Current Outpatient Prescriptions on File Prior to Visit  Medication Sig Dispense Refill  . fluticasone (FLONASE) 50 MCG/ACT nasal spray 2 sprays in each nostril daily.  16 g  11  . loratadine (CLARITIN) 10 MG tablet Take 10 mg by mouth daily. 1 -2 once daily as needed for congestion       . atorvastatin (LIPITOR) 10 MG tablet TAKE 1 TABLET BY MOUTH ONCE DAILY IN THE EVENING  30 tablet  7     Past Medical History  Diagnosis Date  . Hyperlipidemia   . Elevated blood sugar level   . Diverticulosis of colon     BE mild diverticulosis 01/1995  . Osteopenia   . Ringing in ears     worse with aspirin  . Glaucoma 09/24/2005    history of glaucoma (Dr. Ranell Patrick)  . Chest pain   . Tinnitus      Past Surgical History  Procedure Date  . Abdominal hysterectomy 1999    total hysterectomy fibroids  . Cerebral  aneurysm repair 02/2003  . Fine needle aspiration 06/2010    breast     Family History  Problem Relation Age of Onset  . Diabetes Mother   . Hypertension Mother   . Heart disease Mother     CHF  . Diabetes Father   . Diabetes Brother   . Heart disease Brother 32    CVA and MI     History   Social History  . Marital Status: Married    Spouse Name: N/A    Number of Children: N/A  . Years of Education: N/A   Occupational History  . Not on file.   Social History Main Topics  . Smoking status: Never Smoker   . Smokeless tobacco: Not on file  . Alcohol Use: No  . Drug Use: No  . Sexually Active: Not on file   Other Topics Concern  . Not on file   Social History Narrative  . No narrative on file     ROS Constitutional: Negative for fever, chills, diaphoresis, activity change.   HENT: Negative for hearing loss, nosebleeds, congestion, sore throat, facial swelling, drooling, trouble swallowing, neck pain, voice change, sinus pressure and tinnitus.  Eyes: Negative for photophobia, pain, discharge and visual disturbance.  Respiratory: Negative for apnea, cough and wheezing.  Cardiovascular: Negative for  leg swelling.  Gastrointestinal:  Negative for nausea, vomiting, abdominal pain, diarrhea, constipation, blood in stool and abdominal distention.  Genitourinary: Negative for dysuria, urgency, frequency, hematuria and decreased urine volume.  Musculoskeletal: Negative for myalgias, back pain, joint swelling, arthralgias and gait problem.  Skin: Negative for color change, pallor, rash and wound.  Neurological: Negative for dizziness, tremors, seizures, syncope, speech difficulty, weakness, light-headedness, numbness and headaches.  Psychiatric/Behavioral: Negative for suicidal ideas, hallucinations, behavioral problems and agitation. The patient is nervous/anxious.     PHYSICAL EXAM   BP 128/68  Pulse 78  Ht 5\' 4"  (1.626 m)  Wt 166 lb 6.4 oz (75.479 kg)  BMI  28.56 kg/m2  LMP 05/19/1993 Constitutional: She is oriented to person, place, and time. She appears well-developed and well-nourished. No distress.  HENT: No nasal discharge.  Head: Normocephalic and atraumatic.  Eyes: Pupils are equal and round. Right eye exhibits no discharge. Left eye exhibits no discharge.  Neck: Normal range of motion. Neck supple. No JVD present. No thyromegaly present.  Cardiovascular: Normal rate, regular rhythm, normal heart sounds. Exam reveals no gallop and no friction rub. There is a 1/6 systolic ejection murmur at the aortic area.  Pulmonary/Chest: Effort normal and breath sounds normal. No stridor. No respiratory distress. She has no wheezes. She has no rales. She exhibits no tenderness.  Abdominal: Soft. Bowel sounds are normal. She exhibits no distension. There is no tenderness. There is no rebound and no guarding.  Musculoskeletal: Normal range of motion. She exhibits no edema and no tenderness.  Neurological: She is alert and oriented to person, place, and time. Coordination normal.  Skin: Skin is warm and dry. No rash noted. She is not diaphoretic. No erythema. No pallor.  Psychiatric: She has a normal mood and affect. Her behavior is normal. Judgment and thought content normal.     EKG: Her recent EKG showed Normal sinus rhythm with PACs.   ASSESSMENT AND PLAN

## 2011-09-29 NOTE — Assessment & Plan Note (Signed)
Her chest pain is overall atypical and could be related to stress and anxiety. However, it has some features that are concerning for possible angina. Her risk factors include age, hyperlipidemia and family history of premature coronary artery disease. Her baseline ECG does not show any ischemic changes. I recommend a pharmacologic nuclear stress test for further evaluation. The patient does not think she can undergo a treadmill stress test given that she has not exercised in a long time and she has not used a treadmill in the past.

## 2011-09-29 NOTE — Patient Instructions (Signed)
Your physician has requested that you have a lexiscan myoview. For further information please visit [email or web address]. Please follow instruction sheet, as given.  Your physician has requested that you have an echocardiogram. Echocardiography is a painless test that uses sound waves to create images of your heart. It provides your doctor with information about the size and shape of your heart and how well your heart's chambers and valves are working. This procedure takes approximately one hour. There are no restrictions for this procedure.  Follow up after tests.  

## 2011-09-29 NOTE — Assessment & Plan Note (Signed)
The patient's dyspnea is likely might multifactorial and that is likely a component of physical deconditioning. I do recommend an echocardiogram to evaluate diastolic function, filling pressures and pulmonary pressure. She is to followup after her cardiac testing.

## 2011-10-01 ENCOUNTER — Telehealth: Payer: Self-pay | Admitting: Family Medicine

## 2011-10-01 NOTE — Telephone Encounter (Signed)
Patient advised as instructed via telephone. 

## 2011-10-01 NOTE — Telephone Encounter (Signed)
Please let pt know that I reviewed all her old neurology/ headache center/ and neurosurg notes and MRI/ MRA from 2004 on and all looks re assuring I wonder if her headache symptoms could be migraine/ stress related  Please f/u with me after she is done with her cardiac eval to talk further and make a treatment plan for the headaches  In meantime if worse headache- please call

## 2011-10-16 ENCOUNTER — Ambulatory Visit: Payer: Self-pay | Admitting: Cardiovascular Disease

## 2011-10-16 DIAGNOSIS — R079 Chest pain, unspecified: Secondary | ICD-10-CM

## 2011-10-17 ENCOUNTER — Other Ambulatory Visit: Payer: Self-pay | Admitting: Cardiovascular Disease

## 2011-10-28 ENCOUNTER — Other Ambulatory Visit: Payer: Medicare Other

## 2011-10-31 ENCOUNTER — Ambulatory Visit: Payer: Medicare Other | Admitting: Cardiovascular Disease

## 2011-11-18 ENCOUNTER — Other Ambulatory Visit: Payer: Self-pay

## 2011-11-18 ENCOUNTER — Other Ambulatory Visit (INDEPENDENT_AMBULATORY_CARE_PROVIDER_SITE_OTHER): Payer: Medicare Other

## 2011-11-18 DIAGNOSIS — R0600 Dyspnea, unspecified: Secondary | ICD-10-CM

## 2011-11-18 DIAGNOSIS — R0602 Shortness of breath: Secondary | ICD-10-CM

## 2011-11-21 ENCOUNTER — Encounter: Payer: Self-pay | Admitting: Cardiovascular Disease

## 2011-11-21 ENCOUNTER — Ambulatory Visit (INDEPENDENT_AMBULATORY_CARE_PROVIDER_SITE_OTHER): Payer: Medicare Other | Admitting: Cardiovascular Disease

## 2011-11-21 VITALS — BP 144/83 | HR 76 | Ht 64.0 in | Wt 164.0 lb

## 2011-11-21 DIAGNOSIS — R002 Palpitations: Secondary | ICD-10-CM

## 2011-11-21 DIAGNOSIS — R0609 Other forms of dyspnea: Secondary | ICD-10-CM

## 2011-11-21 DIAGNOSIS — R0600 Dyspnea, unspecified: Secondary | ICD-10-CM

## 2011-11-21 NOTE — Assessment & Plan Note (Addendum)
Her palpitations seem to be related to anxiety. Treating her anxiety will likely improve the symptoms. Treatment with a small dose beta blocker for symptomatic relief can be considered also his symptoms persist.

## 2011-11-21 NOTE — Assessment & Plan Note (Signed)
She underwent a nuclear stress test which was normal with no evidence of ischemia. It appears that her symptoms are likely related to stress and anxiety. She also has been having associated headache which is most likely a tension headache. Given her current living situation, she might benefit from treatment with an SSRI.

## 2011-11-21 NOTE — Assessment & Plan Note (Signed)
Echocardiogram showed normal LV systolic function with mild mitral regurgitation, mild to moderate tricuspid regurgitation with no evidence of pulmonary hypertension. These are all benign findings. No further cardiac testing is needed.
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
## 2011-11-21 NOTE — Patient Instructions (Addendum)
Your heart tests were fine.  Follow up as needed.  

## 2012-04-01 ENCOUNTER — Ambulatory Visit: Payer: Self-pay | Admitting: Family Medicine

## 2012-04-01 ENCOUNTER — Encounter: Payer: Self-pay | Admitting: Family Medicine

## 2012-04-02 ENCOUNTER — Encounter: Payer: Self-pay | Admitting: *Deleted

## 2012-07-16 ENCOUNTER — Ambulatory Visit (INDEPENDENT_AMBULATORY_CARE_PROVIDER_SITE_OTHER): Payer: Medicare Other | Admitting: Family Medicine

## 2012-07-16 ENCOUNTER — Encounter: Payer: Self-pay | Admitting: Family Medicine

## 2012-07-16 VITALS — BP 126/82 | HR 79 | Temp 98.6°F | Ht 64.0 in | Wt 167.5 lb

## 2012-07-16 DIAGNOSIS — R21 Rash and other nonspecific skin eruption: Secondary | ICD-10-CM

## 2012-07-16 MED ORDER — MOMETASONE FUROATE 0.1 % EX CREA: TOPICAL_CREAM | Freq: Every day | CUTANEOUS | Status: DC

## 2012-07-16 NOTE — Progress Notes (Signed)
Subjective:    Patient ID: Sue Conway, female    DOB: [birth year], 67 y.o.   MRN: [id number]  HPI Has a rash with itching on her left breast (? Or hyperpigmentation)  Thinks it started early in the month- unsure  occ pain in her breasts on and off - both of them  bx neg in past other breast mammo 11/13  Has not put anything on the rash   Patient Active Problem List  Diagnosis  . HYPERCHOLESTEROLEMIA  . ANXIETY STATE NOS  . TINNITUS  . ALLERGIC RHINITIS  . DIVERTICULOSIS, COLON  . POSTMENOPAUSAL STATUS  . HYPERGLYCEMIA  . POSTMENOPAUSAL STATUS  . LUMP OR MASS IN BREAST  . HA (headache)  . Chest pain  . Dyspnea  . Palpitations   Past Medical History  Diagnosis Date  . Hyperlipidemia   . Elevated blood sugar level   . Diverticulosis of colon     BE mild diverticulosis 01/1995  . Osteopenia   . Ringing in ears     worse with aspirin  . Glaucoma 09/24/2005    history of glaucoma (Dr. Ranell Patrick)  . Chest pain   . Tinnitus    Past Surgical History  Procedure Laterality Date  . Abdominal hysterectomy  1999    total hysterectomy fibroids  . Cerebral aneurysm repair  02/2003  . Fine needle aspiration  06/2010    breast   History  Substance Use Topics  . Smoking status: Never Smoker   . Smokeless tobacco: Not on file  . Alcohol Use: No   Family History  Problem Relation Age of Onset  . Diabetes Mother   . Hypertension Mother   . Heart disease Mother     CHF  . Diabetes Father   . Diabetes Brother   . Heart disease Brother 10    CVA and MI   Allergies  Allergen Reactions  . Aspirin     REACTION: worsens her tinnitis   Current Outpatient Prescriptions on File Prior to Visit  Medication Sig Dispense Refill  . fexofenadine (ALLEGRA) 180 MG tablet Take 180 mg by mouth as needed.      . fluticasone (FLONASE) 50 MCG/ACT nasal spray 2 sprays in each nostril daily.  16 g  11   No current facility-administered medications on file prior to visit.       Review of Systems Review of Systems  Constitutional: Negative for fever, appetite change, fatigue and unexpected weight change.  Eyes: Negative for pain and visual disturbance.  Respiratory: Negative for cough and shortness of breath.   Cardiovascular: Negative for cp or palpitations    Gastrointestinal: Negative for nausea, diarrhea and constipation.  Genitourinary: Negative for urgency and frequency. pos for occ breast soreness (pt drinks caff and does have fibrocytic breasts) Skin: Negative for pallor and pos for rash/itch Neurological: Negative for weakness, light-headedness, numbness and headaches.  Hematological: Negative for adenopathy. Does not bruise/bleed easily.  Psychiatric/Behavioral: Negative for dysphoric mood. Pos for anxiety-pt has gone to mental health and no medicines work so far        Objective:   Physical Exam  Constitutional: She appears well-developed and well-nourished. No distress.  overwt and well app  Eyes: Conjunctivae and EOM are normal. Pupils are equal, round, and reactive to light.  Neck: Normal range of motion. Neck supple.  Cardiovascular: Normal rate and regular rhythm.   Genitourinary: No breast swelling, tenderness, discharge or bleeding.  Breast exam: No mass, nodules, thickening, tenderness, bulging, retraction,  inflamation, nipple discharge noted  No axillary or clavicular LA.  Chaperoned exam.   Rash in 1-2 cm area at 9-10:00 position- scaley/ red/ slt raised on L breat   Musculoskeletal: She exhibits no edema.  Lymphadenopathy:    She has no cervical adenopathy.  Neurological: She is alert.  Skin: Skin is warm and dry. Rash noted. There is erythema.  See breast exam for rash desc  Psychiatric: She has a normal mood and affect.          Assessment & Plan:

## 2012-07-16 NOTE — Patient Instructions (Addendum)
For rash - avoid scented products  Use the elocon cream once daily - if rash is not gone in 2 weeks -call us and we will refer to dermatology  Caffeine causes breast pain in some people- avoid it

## 2012-07-16 NOTE — Assessment & Plan Note (Signed)
Rash on L nipple at 10:00 and 9:00 areas- slt raised with hyperpigmentation and scale Resembles dermatitis- will tx with elocon cream and obs Adv pt if rash is not gone in 1-2 weeks- call and we will ref to derm Nl breast exam  If worse -she will also call  Adv to avoid scented products and detergents also

## 2012-10-28 ENCOUNTER — Telehealth: Payer: Self-pay | Admitting: Family Medicine

## 2012-10-28 NOTE — Telephone Encounter (Signed)
Patient Information:  Caller Name: Shantae  Phone: [phone]  Patient: Sue Conway, Sue Conway  Gender: Female  DOB: April 03, 1946  Age: 67 Years  PCP: [facility] [employer] [facility])  Office Follow Up:  Does the office need to follow up with this patient?: No  Instructions For The Office: N/A  RN Note:  Was assisting son to  put down hay in yard over weekend. Mild sore throat with clear productive cough. Denies sneezing or itchy nose or eyes.  Comfort measures reviewed including use of warm fluids and honey, hydrate and humidify, and Guaifenesin prn to loosen cough. Offered and decline appointment.  Symptoms  Reason For Call & Symptoms: Mild sore throat with "cackeling" clear productive cough.  Reviewed Health History In EMR: Yes  Reviewed Medications In EMR: Yes  Reviewed Allergies In EMR: Yes  Reviewed Surgeries / Procedures: Yes  Date of Onset of Symptoms: 10/24/2012  Treatments Tried: natrual cough drops  Treatments Tried Worked: No  Guideline(s) Used:  Sore Throat  Cough  Disposition Per Guideline:   Home Care  Reason For Disposition Reached:   Cough with no complications  Advice Given:  Cough Medicines:  OTC Cough Syrups: The most common cough suppressant in OTC cough medications is dextromethorphan. Often the letters "DM" appear in the name.  Home Remedy - Hard Candy: Hard candy works just as well as medicine-flavored OTC cough drops. Diabetics should use sugar-free candy.  Home Remedy - Honey: This old home remedy has been shown to help decrease coughing at night. The adult dosage is 2 teaspoons (10 ml) at bedtime. Honey should not be given to infants under one year of age.  Coughing Spasms:  Drink warm fluids. Inhale warm mist (Reason: both relax the airway and loosen up the phlegm).  Suck on cough drops or hard candy to coat the irritated throat.  Expected Course:   The expected course depends on what is causing the cough.  Viral bronchitis (chest cold) causes a cough that  lasts 1 to 3 weeks. Sometimes you may cough up lots of phlegm (sputum, mucus). The mucus can normally be white, gray, yellow, or green.  Call Back If:  Difficulty breathing  Cough lasts more than 3 weeks  Fever lasts > 3 days  You become worse.  Patient Will Follow Care Advice:  YES

## 2013-02-07 ENCOUNTER — Encounter: Payer: Self-pay | Admitting: Family Medicine

## 2013-02-07 ENCOUNTER — Ambulatory Visit (INDEPENDENT_AMBULATORY_CARE_PROVIDER_SITE_OTHER): Payer: Medicare Other | Admitting: Family Medicine

## 2013-02-07 VITALS — BP 140/82 | HR 75 | Temp 98.6°F | Ht 64.0 in | Wt 167.0 lb

## 2013-02-07 DIAGNOSIS — T148XXA Other injury of unspecified body region, initial encounter: Secondary | ICD-10-CM

## 2013-02-07 DIAGNOSIS — W19XXXA Unspecified fall, initial encounter: Secondary | ICD-10-CM
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
## 2013-02-07 NOTE — Patient Instructions (Addendum)
You have a hematoma - (like a deep bruise) on your hip/ pelvis  Use some heat or ice (whichever feels best) This should slowly get better over time If symptoms worsen let me know  Put some non slip material on the bottom of your tub to prevent falls and be careful

## 2013-02-08 NOTE — Assessment & Plan Note (Signed)
Causing hematoma over L hip  Disc fall prev- pt will put a non skid surface over tub floor A handle or rail may also be helpful

## 2013-02-08 NOTE — Assessment & Plan Note (Signed)
Over L hip s/p fall - with small healed abrasion Recommend intermittent heat and ice/ analgesics Update if not imp in 1-2 wk or if worse  Reassuring exam

## 2013-04-04 ENCOUNTER — Ambulatory Visit: Payer: Self-pay | Admitting: Family Medicine

## 2013-04-04 ENCOUNTER — Encounter: Payer: Self-pay | Admitting: Family Medicine

## 2013-04-04 LAB — HM MAMMOGRAPHY: HM Mammogram: NORMAL

## 2013-04-05 ENCOUNTER — Encounter: Payer: Self-pay | Admitting: *Deleted

## 2013-04-05 ENCOUNTER — Encounter: Payer: Self-pay | Admitting: Family Medicine

## 2013-10-11 ENCOUNTER — Ambulatory Visit (INDEPENDENT_AMBULATORY_CARE_PROVIDER_SITE_OTHER): Payer: Commercial Managed Care - HMO | Admitting: Family Medicine

## 2013-10-11 ENCOUNTER — Encounter: Payer: Self-pay | Admitting: Family Medicine

## 2013-10-11 VITALS — BP 138/78 | HR 75 | Temp 98.6°F | Ht 64.0 in | Wt 168.8 lb

## 2013-10-11 DIAGNOSIS — R600 Localized edema: Secondary | ICD-10-CM | POA: Insufficient documentation

## 2013-10-11 DIAGNOSIS — F430 Acute stress reaction: Secondary | ICD-10-CM

## 2013-10-11 DIAGNOSIS — IMO0002 Reserved for concepts with insufficient information to code with codable children: Secondary | ICD-10-CM

## 2013-10-11 DIAGNOSIS — T7491XA Unspecified adult maltreatment, confirmed, initial encounter: Secondary | ICD-10-CM

## 2013-10-11 DIAGNOSIS — R609 Edema, unspecified: Secondary | ICD-10-CM

## 2013-10-11 LAB — COMPREHENSIVE METABOLIC PANEL
ALK PHOS: 72 U/L (ref 39–117)
ALT: 12 U/L (ref 0–35)
AST: 20 U/L (ref 0–37)
Albumin: 3.9 g/dL (ref 3.5–5.2)
BILIRUBIN TOTAL: 0.5 mg/dL (ref 0.2–1.2)
BUN: 12 mg/dL (ref 6–23)
CO2: 28 mEq/L (ref 19–32)
CREATININE: 0.8 mg/dL (ref 0.4–1.2)
Calcium: 9.1 mg/dL (ref 8.4–10.5)
Chloride: 106 mEq/L (ref 96–112)
GFR: 93.93 mL/min (ref >60.00)
Glucose, Bld: 86 mg/dL (ref 70–99)
Potassium: 3.9 mEq/L (ref 3.5–5.1)
Sodium: 141 mEq/L (ref 135–145)
Total Protein: 7.3 g/dL (ref 6.0–8.3)

## 2013-10-11 LAB — BRAIN NATRIURETIC PEPTIDE: Pro B Natriuretic peptide (BNP): 51 pg/mL (ref 0.0–100.0)

## 2013-10-11 MED ORDER — HYDROCHLOROTHIAZIDE 25 MG PO TABS: 25.0000 mg | ORAL_TABLET | Freq: Every day | ORAL | Status: DC

## 2013-10-11 NOTE — Patient Instructions (Signed)
Keep looking for a place to stay- if you do not feel safe in your home  Talk to a lawyer about your situation and what your options are  Labs today for swelling  When we get results -I will tell you to fill the px for the diuretic (hctz) - hold on to it until then  Elevate feet whenever you can/ try support hose/ drink water and avoid excess sodium

## 2013-10-11 NOTE — Progress Notes (Signed)
Pre visit review using our clinic review tool, if applicable. No additional management support is needed unless otherwise documented below in the visit note. 
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
## 2013-10-11 NOTE — Assessment & Plan Note (Signed)
None today- but per pt worse in heat and after sitting  Sounds dependent No cardiac symptoms Disc elevating legs/ supp hose/ dec na in diet  Lab today incl BNP and cmet  If nl -will start hctz and arrange f/u
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
## 2013-10-31 ENCOUNTER — Ambulatory Visit: Payer: Commercial Managed Care - HMO | Admitting: Family Medicine

## 2013-11-29 ENCOUNTER — Ambulatory Visit (INDEPENDENT_AMBULATORY_CARE_PROVIDER_SITE_OTHER): Payer: Commercial Managed Care - HMO | Admitting: Family Medicine

## 2013-11-29 ENCOUNTER — Encounter: Payer: Self-pay | Admitting: Family Medicine

## 2013-11-29 VITALS — BP 134/78 | HR 76 | Temp 98.3°F | Ht 64.0 in | Wt 169.5 lb

## 2013-11-29 DIAGNOSIS — R609 Edema, unspecified: Secondary | ICD-10-CM

## 2013-11-29 DIAGNOSIS — R600 Localized edema: Secondary | ICD-10-CM

## 2013-11-29 DIAGNOSIS — F430 Acute stress reaction: Secondary | ICD-10-CM

## 2013-11-29 NOTE — Assessment & Plan Note (Signed)
Much improved after hctz  No side eff or problems Bmp today  F/u 6 mo for PE

## 2013-11-29 NOTE — Patient Instructions (Signed)
Labs today  I'm glad you are doing better  Stay safe  Stay on the hctz Schedule a physical in about 6 months

## 2013-11-29 NOTE — Progress Notes (Signed)
Pre visit review using our clinic review tool, if applicable. No additional management support is needed unless otherwise documented below in the visit note. 
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
## 2013-11-30 LAB — BASIC METABOLIC PANEL
BUN: 13 mg/dL (ref 6–23)
CHLORIDE: 103 meq/L (ref 96–112)
CO2: 28 mEq/L (ref 19–32)
Calcium: 9.3 mg/dL (ref 8.4–10.5)
Creatinine, Ser: 0.8 mg/dL (ref 0.4–1.2)
GFR: 92.52 mL/min (ref >60.00)
GLUCOSE: 96 mg/dL (ref 70–99)
POTASSIUM: 3.8 meq/L (ref 3.5–5.1)
Sodium: 137 mEq/L (ref 135–145)

## 2013-12-01 ENCOUNTER — Encounter: Payer: Self-pay | Admitting: *Deleted

## 2014-02-14 ENCOUNTER — Ambulatory Visit (INDEPENDENT_AMBULATORY_CARE_PROVIDER_SITE_OTHER): Payer: Commercial Managed Care - HMO | Admitting: Family Medicine

## 2014-02-14 ENCOUNTER — Encounter: Payer: Self-pay | Admitting: Family Medicine

## 2014-02-14 VITALS — BP 160/80 | HR 76 | Temp 98.2°F

## 2014-02-14 DIAGNOSIS — K3189 Other diseases of stomach and duodenum: Secondary | ICD-10-CM

## 2014-02-14 DIAGNOSIS — E7800 Pure hypercholesterolemia, unspecified: Secondary | ICD-10-CM

## 2014-02-14 DIAGNOSIS — R0600 Dyspnea, unspecified: Secondary | ICD-10-CM

## 2014-02-14 DIAGNOSIS — R0609 Other forms of dyspnea: Secondary | ICD-10-CM

## 2014-02-14 DIAGNOSIS — R030 Elevated blood-pressure reading, without diagnosis of hypertension: Secondary | ICD-10-CM | POA: Insufficient documentation

## 2014-02-14 DIAGNOSIS — R0989 Other specified symptoms and signs involving the circulatory and respiratory systems: Secondary | ICD-10-CM

## 2014-02-14 DIAGNOSIS — F430 Acute stress reaction: Secondary | ICD-10-CM

## 2014-02-14 DIAGNOSIS — IMO0001 Reserved for inherently not codable concepts without codable children: Secondary | ICD-10-CM

## 2014-02-14 DIAGNOSIS — R1013 Epigastric pain: Secondary | ICD-10-CM | POA: Insufficient documentation

## 2014-02-14 DIAGNOSIS — R42 Dizziness and giddiness: Secondary | ICD-10-CM

## 2014-02-14 MED ORDER — RANITIDINE HCL 150 MG PO CAPS: 150.0000 mg | ORAL_CAPSULE | Freq: Two times a day (BID) | ORAL | Status: DC
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
## 2014-02-14 NOTE — Progress Notes (Signed)
Pre visit review using our clinic review tool, if applicable. No additional management support is needed unless otherwise documented below in the visit note. 

## 2014-02-14 NOTE — Patient Instructions (Addendum)
Your blood pressure is elevated  Start back on your hctz daily  Take care of yourself  Start generic zantac (ranitidine) 150 mg twice daily for possible acid reflux  Lab today Follow up with me in about a week    If symptoms worsen in the meantime please let me know

## 2014-02-15 LAB — CBC WITH DIFFERENTIAL/PLATELET
Basophils Absolute: 0 10*3/uL (ref 0.0–0.1)
Basophils Relative: 0.4 % (ref 0.0–3.0)
EOS PCT: 2.1 % (ref 0.0–5.0)
Eosinophils Absolute: 0.1 10*3/uL (ref 0.0–0.7)
HCT: 36.4 % (ref 36.0–46.0)
Hemoglobin: 11.8 g/dL — ABNORMAL LOW (ref 12.0–15.0)
Lymphocytes Relative: 25.6 % (ref 12.0–46.0)
Lymphs Abs: 1.7 10*3/uL (ref 0.7–4.0)
MCHC: 32.4 g/dL (ref 30.0–36.0)
MCV: 80.7 fl (ref 78.0–100.0)
Monocytes Absolute: 0.4 10*3/uL (ref 0.1–1.0)
Monocytes Relative: 5.7 % (ref 3.0–12.0)
Neutro Abs: 4.3 10*3/uL (ref 1.4–7.7)
Neutrophils Relative %: 66.2 % (ref 43.0–77.0)
PLATELETS: 237 10*3/uL (ref 150.0–400.0)
RBC: 4.51 Mil/uL (ref 3.87–5.11)
RDW: 16.1 % — AB (ref 11.5–15.5)
WBC: 6.6 10*3/uL (ref 4.0–10.5)

## 2014-02-15 LAB — COMPREHENSIVE METABOLIC PANEL
ALK PHOS: 83 U/L (ref 39–117)
ALT: 16 U/L (ref 0–35)
AST: 24 U/L (ref 0–37)
Albumin: 4 g/dL (ref 3.5–5.2)
BUN: 11 mg/dL (ref 6–23)
CO2: 26 mEq/L (ref 19–32)
Calcium: 9.1 mg/dL (ref 8.4–10.5)
Chloride: 106 mEq/L (ref 96–112)
Creatinine, Ser: 0.8 mg/dL (ref 0.4–1.2)
GFR: 88.57 mL/min (ref >60.00)
Glucose, Bld: 85 mg/dL (ref 70–99)
Potassium: 3.9 mEq/L (ref 3.5–5.1)
SODIUM: 139 meq/L (ref 135–145)
TOTAL PROTEIN: 7.7 g/dL (ref 6.0–8.3)
Total Bilirubin: 0.3 mg/dL (ref 0.2–1.2)

## 2014-02-15 LAB — LIPID PANEL
CHOLESTEROL: 217 mg/dL — AB (ref 0–200)
HDL: 54.2 mg/dL (ref >39.00)
LDL Cholesterol: 143 mg/dL — ABNORMAL HIGH (ref 0–99)
NonHDL: 162.8
TRIGLYCERIDES: 100 mg/dL (ref 0.0–149.0)
Total CHOL/HDL Ratio: 4
VLDL: 20 mg/dL (ref 0.0–40.0)

## 2014-02-15 LAB — TSH: TSH: 0.39 u[IU]/mL (ref 0.35–4.50)

## 2014-02-15 NOTE — Assessment & Plan Note (Signed)
Disc goals for lipids and reasons to control them Rev labs with pt Rev low sat fat diet in detail  Need to check this off of pravastatin
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
## 2014-02-15 NOTE — Assessment & Plan Note (Signed)
With reflux symptoms  Has had cardiol w/u in the past  Will tx GERD Disc anxiety and stressors  Reassuring vitals and EKG F/u next week

## 2014-02-15 NOTE — Assessment & Plan Note (Signed)
With a choking sensation- unsure if this is due to acid reflux or anxiety EKG stable  Will tx with zantac 150 mg bid and f/u in a week

## 2014-02-15 NOTE — Assessment & Plan Note (Signed)
Suspect 2ndary to anxiety stress today  She also came off of her hctz - urged her to re start this  F/u next week for re check

## 2014-02-20 ENCOUNTER — Encounter: Payer: Self-pay | Admitting: *Deleted

## 2014-03-03 ENCOUNTER — Encounter: Payer: Self-pay | Admitting: Family Medicine

## 2014-03-03 ENCOUNTER — Ambulatory Visit (INDEPENDENT_AMBULATORY_CARE_PROVIDER_SITE_OTHER): Payer: Commercial Managed Care - HMO | Admitting: Family Medicine

## 2014-03-03 VITALS — BP 118/70 | HR 82 | Temp 98.7°F | Ht 64.0 in | Wt 170.5 lb

## 2014-03-03 DIAGNOSIS — Z23 Encounter for immunization: Secondary | ICD-10-CM

## 2014-03-03 DIAGNOSIS — R030 Elevated blood-pressure reading, without diagnosis of hypertension: Secondary | ICD-10-CM

## 2014-03-03 DIAGNOSIS — Z1211 Encounter for screening for malignant neoplasm of colon: Secondary | ICD-10-CM

## 2014-03-03 DIAGNOSIS — E7800 Pure hypercholesterolemia, unspecified: Secondary | ICD-10-CM

## 2014-03-03 DIAGNOSIS — R1013 Epigastric pain: Secondary | ICD-10-CM

## 2014-03-03 DIAGNOSIS — D649 Anemia, unspecified: Secondary | ICD-10-CM

## 2014-03-03 DIAGNOSIS — IMO0001 Reserved for inherently not codable concepts without codable children: Secondary | ICD-10-CM

## 2014-03-03 NOTE — Patient Instructions (Signed)
For cholesterol -please work on diet  Avoid red meat/ fried foods/ egg yolks/ fatty breakfast meats/ butter, cheese and high fat dairy/ and shellfish   Also please do stool card to look for any blood in stool  Blood pressure looks good  Schedule fasting labs in 2 months then follow up a week later

## 2014-03-03 NOTE — Assessment & Plan Note (Signed)
Disc goals for lipids and reasons to control them Rev labs with pt Rev low sat fat diet in detail Intol of lipitor Will work on diet and re check in 2 mo
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
## 2014-03-03 NOTE — Progress Notes (Signed)
Pre visit review using our clinic review tool, if applicable. No additional management support is needed unless otherwise documented below in the visit note. 

## 2014-03-03 NOTE — Assessment & Plan Note (Signed)
Improved with zantac 150 bid  Will continue it

## 2014-03-03 NOTE — Assessment & Plan Note (Signed)
Much improved today with less stress hctz also helps keep it down Enc DASH diet and exercise

## 2014-03-03 NOTE — Assessment & Plan Note (Signed)
Unsure if significant Will do IFOB card  Also re check cbc with ferritin in 2 mo

## 2014-03-03 NOTE — Assessment & Plan Note (Signed)
ifob card today Very slt anemic  colonosc ws 09

## 2014-03-10 ENCOUNTER — Other Ambulatory Visit (INDEPENDENT_AMBULATORY_CARE_PROVIDER_SITE_OTHER): Payer: Commercial Managed Care - HMO

## 2014-03-10 DIAGNOSIS — Z1211 Encounter for screening for malignant neoplasm of colon: Secondary | ICD-10-CM

## 2014-03-10 LAB — FECAL OCCULT BLOOD, IMMUNOCHEMICAL: FECAL OCCULT BLD: NEGATIVE

## 2014-03-13 ENCOUNTER — Encounter: Payer: Self-pay | Admitting: *Deleted

## 2014-04-16 ENCOUNTER — Other Ambulatory Visit: Payer: Self-pay | Admitting: Family Medicine

## 2014-06-06 ENCOUNTER — Encounter: Payer: Self-pay | Admitting: Family Medicine

## 2014-06-06 ENCOUNTER — Ambulatory Visit (INDEPENDENT_AMBULATORY_CARE_PROVIDER_SITE_OTHER): Payer: Commercial Managed Care - HMO | Admitting: Family Medicine

## 2014-06-06 VITALS — BP 116/74 | HR 76 | Temp 97.8°F | Ht 64.0 in | Wt 172.8 lb

## 2014-06-06 DIAGNOSIS — Z1211 Encounter for screening for malignant neoplasm of colon: Secondary | ICD-10-CM | POA: Diagnosis not present

## 2014-06-06 DIAGNOSIS — Z23 Encounter for immunization: Secondary | ICD-10-CM | POA: Diagnosis not present

## 2014-06-06 DIAGNOSIS — R739 Hyperglycemia, unspecified: Secondary | ICD-10-CM

## 2014-06-06 DIAGNOSIS — T7491XD Unspecified adult maltreatment, confirmed, subsequent encounter: Secondary | ICD-10-CM

## 2014-06-06 DIAGNOSIS — E7800 Pure hypercholesterolemia, unspecified: Secondary | ICD-10-CM

## 2014-06-06 DIAGNOSIS — Z0100 Encounter for examination of eyes and vision without abnormal findings: Secondary | ICD-10-CM | POA: Diagnosis not present

## 2014-06-06 DIAGNOSIS — E2839 Other primary ovarian failure: Secondary | ICD-10-CM

## 2014-06-06 DIAGNOSIS — Z0000 Encounter for general adult medical examination without abnormal findings: Secondary | ICD-10-CM | POA: Insufficient documentation

## 2014-06-06 LAB — COMPREHENSIVE METABOLIC PANEL
ALBUMIN: 4 g/dL (ref 3.5–5.2)
ALK PHOS: 73 U/L (ref 39–117)
ALT: 14 U/L (ref 0–35)
AST: 22 U/L (ref 0–37)
BUN: 11 mg/dL (ref 6–23)
CHLORIDE: 106 meq/L (ref 96–112)
CO2: 30 mEq/L (ref 19–32)
Calcium: 9.3 mg/dL (ref 8.4–10.5)
Creatinine, Ser: 0.79 mg/dL (ref 0.40–1.20)
GFR: 92.39 mL/min (ref >60.00)
Glucose, Bld: 96 mg/dL (ref 70–99)
POTASSIUM: 3.9 meq/L (ref 3.5–5.1)
Sodium: 141 mEq/L (ref 135–145)
TOTAL PROTEIN: 7.1 g/dL (ref 6.0–8.3)
Total Bilirubin: 0.3 mg/dL (ref 0.2–1.2)

## 2014-06-06 LAB — TSH: TSH: 0.86 u[IU]/mL (ref 0.35–4.50)

## 2014-06-06 LAB — LIPID PANEL
CHOL/HDL RATIO: 3
Cholesterol: 190 mg/dL (ref 0–200)
HDL: 55.8 mg/dL (ref >39.00)
LDL Cholesterol: 112 mg/dL — ABNORMAL HIGH (ref 0–99)
NonHDL: 134.2
Triglycerides: 110 mg/dL (ref 0.0–149.0)
VLDL: 22 mg/dL (ref 0.0–40.0)

## 2014-06-06 LAB — HEMOGLOBIN A1C: Hgb A1c MFr Bld: 6.3 % (ref 4.6–6.5)
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
## 2014-06-06 NOTE — Progress Notes (Signed)
Pre visit review using our clinic review tool, if applicable. No additional management support is needed unless otherwise documented below in the visit note. 

## 2014-06-07 ENCOUNTER — Encounter: Payer: Self-pay | Admitting: *Deleted

## 2014-06-07 NOTE — Assessment & Plan Note (Signed)
A1C today  Stressed imp of low glycemic diet and wt management to avoid DM
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
## 2014-06-07 NOTE — Assessment & Plan Note (Signed)
utd colonosc IFOB card neg from 10/15

## 2014-06-07 NOTE — Assessment & Plan Note (Signed)
Reviewed health habits including diet and exercise and skin cancer prevention Reviewed appropriate screening tests for age  Also reviewed health mt list, fam hx and immunization status , as well as social and family history   See HPI Labs reviewed prevnar today  Ref for dexa  Ref for eye exam  Given packet to work on an Insurance underwriter and enc to work on that, stressed its importance

## 2014-06-07 NOTE — Assessment & Plan Note (Signed)
Ref for eye exam She is overdue for this No vision c/o

## 2014-06-07 NOTE — Assessment & Plan Note (Signed)
Ref for dexa 

## 2014-06-13 ENCOUNTER — Ambulatory Visit: Payer: Self-pay | Admitting: Family Medicine

## 2014-06-13 DIAGNOSIS — Z1231 Encounter for screening mammogram for malignant neoplasm of breast: Secondary | ICD-10-CM | POA: Diagnosis not present

## 2014-06-14 ENCOUNTER — Encounter: Payer: Self-pay | Admitting: Family Medicine

## 2014-06-15 ENCOUNTER — Encounter: Payer: Self-pay | Admitting: *Deleted

## 2014-06-15 LAB — HM MAMMOGRAPHY: HM Mammogram: NORMAL

## 2014-06-26 DIAGNOSIS — H25013 Cortical age-related cataract, bilateral: Secondary | ICD-10-CM | POA: Diagnosis not present

## 2014-06-26 DIAGNOSIS — H35033 Hypertensive retinopathy, bilateral: Secondary | ICD-10-CM | POA: Diagnosis not present

## 2014-06-26 DIAGNOSIS — H524 Presbyopia: Secondary | ICD-10-CM | POA: Diagnosis not present

## 2014-07-19 ENCOUNTER — Ambulatory Visit: Payer: Self-pay | Admitting: Family Medicine

## 2014-07-19 DIAGNOSIS — E2839 Other primary ovarian failure: Secondary | ICD-10-CM | POA: Diagnosis not present

## 2014-07-19 DIAGNOSIS — M8580 Other specified disorders of bone density and structure, unspecified site: Secondary | ICD-10-CM | POA: Diagnosis not present

## 2014-07-19 DIAGNOSIS — M8589 Other specified disorders of bone density and structure, multiple sites: Secondary | ICD-10-CM | POA: Diagnosis not present

## 2014-07-20 ENCOUNTER — Encounter: Payer: Self-pay | Admitting: Family Medicine

## 2014-07-24 ENCOUNTER — Telehealth: Payer: Self-pay

## 2014-07-24 NOTE — Telephone Encounter (Signed)
Patient aware of dexa scan results and directions.

## 2014-07-24 NOTE — Telephone Encounter (Signed)
-----   Message from Abner Greenspan, MD sent at 07/23/2014  9:35 AM EST ----- Osteopenia at spine Overall stable from last check  Continue calcium and vitamin D Note for health mt please  We will re check in 2y

## 2014-08-07 ENCOUNTER — Telehealth: Payer: Self-pay

## 2014-08-07 NOTE — Telephone Encounter (Signed)
Pt left note; having diarrhea for 3 days; in last 24 hours had loose stools x 4 . Pt is eating and drinking normally; Pt is feeling better at this moment. No abd pain and no fever. Pt has not taken OTC med for diarrhea. [facility].

## 2014-08-07 NOTE — Telephone Encounter (Signed)
Keep up with fluid intake - most likely this is viral and will run its course  I do not recommend anti diarrheals unless she has been evaluated and other causes ruled out  Follow up if no improvement or if worse

## 2014-08-08 NOTE — Telephone Encounter (Signed)
Spoke with patient regarding diarrhea,  Advised her to keep up with fluid intake.  She is going to try the [employer] as well.  If not better she will call back.

## 2014-09-11 ENCOUNTER — Ambulatory Visit (INDEPENDENT_AMBULATORY_CARE_PROVIDER_SITE_OTHER): Payer: Commercial Managed Care - HMO | Admitting: Primary Care

## 2014-09-11 ENCOUNTER — Encounter: Payer: Self-pay | Admitting: Primary Care

## 2014-09-11 VITALS — BP 118/70 | HR 85 | Temp 98.3°F | Ht 64.0 in | Wt 170.0 lb

## 2014-09-11 DIAGNOSIS — R059 Cough, unspecified: Secondary | ICD-10-CM

## 2014-09-11 DIAGNOSIS — R05 Cough: Secondary | ICD-10-CM

## 2014-09-11 MED ORDER — BENZONATATE 200 MG PO CAPS: 200.0000 mg | ORAL_CAPSULE | Freq: Three times a day (TID) | ORAL | Status: DC | PRN

## 2014-09-11 NOTE — Progress Notes (Signed)
Subjective:    Patient ID: Sue Conway, female    DOB: Jun 06, 1945, 69 y.o.   MRN: [id number]  HPI  [name] is a 69 year old female who presents today with a chief complaint of cough. She first noticed her cough on Saturday with worsening cough last night. She was able to sleep last night without difficulty. She developed a sore throat this morning and reports she couldn't talk with her sister over the phone due to her cough. She's been taking zyrtec and flonase. Denies fevers, chills, nausea, ear pain. Repetitive talking makes cough worse. She's not tried OTC cough medication.  Review of Systems  Constitutional: Negative for fever and chills.  HENT: Positive for congestion and sore throat. Negative for ear pain, rhinorrhea and sinus pressure.   Respiratory: Positive for cough. Negative for shortness of breath.   Cardiovascular: Negative for chest pain.  Gastrointestinal: Negative for nausea and vomiting.  Musculoskeletal: Negative for myalgias.  Neurological: Negative for headaches.  Hematological: Negative for adenopathy.       Past Medical History  Diagnosis Date  . Hyperlipidemia   . Elevated blood sugar level   . Diverticulosis of colon     BE mild diverticulosis 01/1995  . Osteopenia   . Ringing in ears     worse with aspirin  . Glaucoma 09/24/2005    history of glaucoma (Dr. Darolyn Rua)  . Chest pain   . Tinnitus     History   Social History  . Marital Status: Married    Spouse Name: N/A  . Number of Children: N/A  . Years of Education: N/A   Occupational History  . Not on file.   Social History Main Topics  . Smoking status: Never Smoker   . Smokeless tobacco: Not on file  . Alcohol Use: No  . Drug Use: No  . Sexual Activity: Not on file   Other Topics Concern  . Not on file   Social History Narrative    Past Surgical History  Procedure Laterality Date  . Abdominal hysterectomy  1999    total hysterectomy fibroids  . Cerebral aneurysm repair   02/2003  . Fine needle aspiration  06/2010    breast    Family History  Problem Relation Age of Onset  . Diabetes Mother   . Hypertension Mother   . Heart disease Mother     CHF  . Diabetes Father   . Diabetes Brother   . Heart disease Brother 72    CVA and MI    Allergies  Allergen Reactions  . Aspirin     REACTION: worsens her tinnitis  . Atorvastatin     Leg muscle pain   . Prozac [Fluoxetine Hcl]   . Zoloft [Sertraline Hcl]     Current Outpatient Prescriptions on File Prior to Visit  Medication Sig Dispense Refill  . Cetirizine HCl (ZYRTEC PO) Take 1 tablet by mouth as needed.    . fluticasone (FLONASE) 50 MCG/ACT nasal spray as needed. 2 sprays in each nostril daily.    . hydrochlorothiazide (HYDRODIURIL) 25 MG tablet TAKE 1 TABLET BY MOUTH EVERY DAY 30 tablet 5  . ranitidine (ZANTAC) 150 MG capsule Take 1 capsule (150 mg total) by mouth 2 (two) times daily. 60 capsule 5   No current facility-administered medications on file prior to visit.    BP 118/70 mmHg  Pulse 85  Temp(Src) 98.3 F (36.8 C) (Oral)  Ht 5\' 4"  (1.626 m)  Wt 170  lb (77.111 kg)  BMI 29.17 kg/m2  SpO2 94%  LMP 05/19/1993    Objective:   Physical Exam  Constitutional: She is oriented to person, place, and time. She appears well-developed.  Does not appear sickly  HENT:  Right Ear: Tympanic membrane and ear canal normal.  Left Ear: Tympanic membrane and ear canal normal.  Nose: Nose normal.  Mouth/Throat: Oropharynx is clear and moist.  Eyes: Conjunctivae are normal. Pupils are equal, round, and reactive to light.  Neck: Neck supple.  Cardiovascular: Normal rate and regular rhythm.   Pulmonary/Chest: Effort normal and breath sounds normal.  Lymphadenopathy:    She has no cervical adenopathy.  Neurological: She is alert and oriented to person, place, and time.  Skin: Skin is warm and dry.          Assessment & Plan:  URI: Common cold.  Cough present for 2 days. No fevers,  chills. Suspect viral cold at this point. Continue Zyrtec, Flonase. Start Tessalon Pearls three times daily PRN. Follow up if no improvement or worsening symptoms by Friday this week.

## 2014-09-11 NOTE — Patient Instructions (Addendum)
Your cough is likely due to the common cold which is caused by a virus. Continue taking the Zyrtec and Flonase. Start taking the Tessalon Pearls three times daily as needed for cough. Let me know if you aren't feeling any better by Friday this week. Take care!   Upper Respiratory Infection, Adult An upper respiratory infection (URI) is also sometimes known as the common cold. The upper respiratory tract includes the nose, sinuses, throat, trachea, and bronchi. Bronchi are the airways leading to the lungs. Most people improve within 1 week, but symptoms can last up to 2 weeks. A residual cough may last even longer.  CAUSES Many different viruses can infect the tissues lining the upper respiratory tract. The tissues become irritated and inflamed and often become very moist. Mucus production is also common. A cold is contagious. You can easily spread the virus to others by oral contact. This includes kissing, sharing a glass, coughing, or sneezing. Touching your mouth or nose and then touching a surface, which is then touched by another person, can also spread the virus. SYMPTOMS  Symptoms typically develop 1 to 3 days after you come in contact with a cold virus. Symptoms vary from person to person. They may include:  Runny nose.  Sneezing.  Nasal congestion.  Sinus irritation.  Sore throat.  Loss of voice (laryngitis).  Cough.  Fatigue.  Muscle aches.  Loss of appetite.  Headache.  Low-grade fever. DIAGNOSIS  You might diagnose your own cold based on familiar symptoms, since most people get a cold 2 to 3 times a year. Your caregiver can confirm this based on your exam. Most importantly, your caregiver can check that your symptoms are not due to another disease such as strep throat, sinusitis, pneumonia, asthma, or epiglottitis. Blood tests, throat tests, and X-rays are not necessary to diagnose a common cold, but they may sometimes be helpful in excluding other more serious  diseases. Your caregiver will decide if any further tests are required. RISKS AND COMPLICATIONS  You may be at risk for a more severe case of the common cold if you smoke cigarettes, have chronic heart disease (such as heart failure) or lung disease (such as asthma), or if you have a weakened immune system. The very young and very old are also at risk for more serious infections. Bacterial sinusitis, middle ear infections, and bacterial pneumonia can complicate the common cold. The common cold can worsen asthma and chronic obstructive pulmonary disease (COPD). Sometimes, these complications can require emergency medical care and may be life-threatening. PREVENTION  The best way to protect against getting a cold is to practice good hygiene. Avoid oral or hand contact with people with cold symptoms. Wash your hands often if contact occurs. There is no clear evidence that vitamin C, vitamin E, echinacea, or exercise reduces the chance of developing a cold. However, it is always recommended to get plenty of rest and practice good nutrition. TREATMENT  Treatment is directed at relieving symptoms. There is no cure. Antibiotics are not effective, because the infection is caused by a virus, not by bacteria. Treatment may include:  Increased fluid intake. Sports drinks offer valuable electrolytes, sugars, and fluids.  Breathing heated mist or steam (vaporizer or shower).  Eating chicken soup or other clear broths, and maintaining good nutrition.  Getting plenty of rest.  Using gargles or lozenges for comfort.  Controlling fevers with ibuprofen or acetaminophen as directed by your caregiver.  Increasing usage of your inhaler if you have asthma. Zinc  gel and zinc lozenges, taken in the first 24 hours of the common cold, can shorten the duration and lessen the severity of symptoms. Pain medicines may help with fever, muscle aches, and throat pain. A variety of non-prescription medicines are available to  treat congestion and runny nose. Your caregiver can make recommendations and may suggest nasal or lung inhalers for other symptoms.  HOME CARE INSTRUCTIONS   Only take over-the-counter or prescription medicines for pain, discomfort, or fever as directed by your caregiver.  Use a warm mist humidifier or inhale steam from a shower to increase air moisture. This may keep secretions moist and make it easier to breathe.  Drink enough water and fluids to keep your urine clear or pale yellow.  Rest as needed.  Return to work when your temperature has returned to normal or as your caregiver advises. You may need to stay home longer to avoid infecting others. You can also use a face mask and careful hand washing to prevent spread of the virus. SEEK MEDICAL CARE IF:   After the first few days, you feel you are getting worse rather than better.  You need your caregiver's advice about medicines to control symptoms.  You develop chills, worsening shortness of breath, or brown or red sputum. These may be signs of pneumonia.  You develop yellow or brown nasal discharge or pain in the face, especially when you bend forward. These may be signs of sinusitis.  You develop a fever, swollen neck glands, pain with swallowing, or white areas in the back of your throat. These may be signs of strep throat. SEEK IMMEDIATE MEDICAL CARE IF:   You have a fever.  You develop severe or persistent headache, ear pain, sinus pain, or chest pain.  You develop wheezing, a prolonged cough, cough up blood, or have a change in your usual mucus (if you have chronic lung disease).  You develop sore muscles or a stiff neck. Document Released: 10/29/2000 Document Revised: 07/28/2011 Document Reviewed: 08/10/2013 [facility] Patient Information 2015 [place], [place]. This information is not intended to replace advice given to you by your health care provider. Make sure you discuss any questions you have with your health care  provider.

## 2014-09-11 NOTE — Progress Notes (Signed)
Pre visit review using our clinic review tool, if applicable. No additional management support is needed unless otherwise documented below in the visit note. 

## 2014-10-27 DIAGNOSIS — L500 Allergic urticaria: Secondary | ICD-10-CM | POA: Diagnosis not present

## 2014-10-27 DIAGNOSIS — W57XXXA Bitten or stung by nonvenomous insect and other nonvenomous arthropods, initial encounter: Secondary | ICD-10-CM | POA: Diagnosis not present

## 2014-11-02 ENCOUNTER — Other Ambulatory Visit: Payer: Self-pay | Admitting: Family Medicine

## 2015-03-29 ENCOUNTER — Ambulatory Visit: Payer: Commercial Managed Care - HMO

## 2015-04-03 ENCOUNTER — Ambulatory Visit (INDEPENDENT_AMBULATORY_CARE_PROVIDER_SITE_OTHER): Payer: Commercial Managed Care - HMO | Admitting: Internal Medicine

## 2015-04-03 ENCOUNTER — Encounter: Payer: Self-pay | Admitting: Internal Medicine

## 2015-04-03 ENCOUNTER — Ambulatory Visit: Payer: Commercial Managed Care - HMO

## 2015-04-03 VITALS — BP 120/70 | HR 96 | Temp 97.8°F | Wt 166.0 lb

## 2015-04-03 DIAGNOSIS — J069 Acute upper respiratory infection, unspecified: Secondary | ICD-10-CM | POA: Diagnosis not present

## 2015-04-03 DIAGNOSIS — R05 Cough: Secondary | ICD-10-CM | POA: Diagnosis not present

## 2015-04-03 DIAGNOSIS — R059 Cough, unspecified: Secondary | ICD-10-CM

## 2015-04-03 MED ORDER — BENZONATATE 200 MG PO CAPS: 200.0000 mg | ORAL_CAPSULE | Freq: Three times a day (TID) | ORAL | Status: DC | PRN

## 2015-04-03 MED ORDER — AZITHROMYCIN 250 MG PO TABS: ORAL_TABLET | ORAL | Status: DC

## 2015-04-03 NOTE — Progress Notes (Signed)
Pre visit review using our clinic review tool, if applicable. No additional management support is needed unless otherwise documented below in the visit note. 

## 2015-04-03 NOTE — Progress Notes (Signed)
HPI  Pt presents to the clinic today with c/o sneezing, sore throat, cough and chest congestion. This started 1 week ago. The cough is productive of green mucous. She denies shortness of breath. She denies fever, chill and body aches. She has tried Zyrtec and Flonase without any relief. She has no history of allergies or breathing problems. She has not had sick contacts. She has not had her flu shot.  Review of Systems      Past Medical History  Diagnosis Date  . Hyperlipidemia   . Elevated blood sugar level   . Diverticulosis of colon     BE mild diverticulosis 01/1995  . Osteopenia   . Ringing in ears     worse with aspirin  . Glaucoma 09/24/2005    history of glaucoma (Dr. Darolyn Rua)  . Chest pain   . Tinnitus     Family History  Problem Relation Age of Onset  . Diabetes Mother   . Hypertension Mother   . Heart disease Mother     CHF  . Diabetes Father   . Diabetes Brother   . Heart disease Brother 55    CVA and MI    Social History   Social History  . Marital Status: Married    Spouse Name: N/A  . Number of Children: N/A  . Years of Education: N/A   Occupational History  . Not on file.   Social History Main Topics  . Smoking status: Never Smoker   . Smokeless tobacco: Not on file  . Alcohol Use: No  . Drug Use: No  . Sexual Activity: Not on file   Other Topics Concern  . Not on file   Social History Narrative    Allergies  Allergen Reactions  . Aspirin     REACTION: worsens her tinnitis  . Atorvastatin     Leg muscle pain   . Prozac [Fluoxetine Hcl]   . Zoloft [Sertraline Hcl]      Constitutional: Denies headache, fever or abrupt weight changes.  HEENT:  Positive sore throat. Denies eye redness, eye pain, pressure behind the eyes, facial pain, nasal congestion, ear pain, ringing in the ears, wax buildup, runny nose or bloody nose. Respiratory: Positive cough. Denies difficulty breathing or shortness of breath.  Cardiovascular: Denies chest  pain, chest tightness, palpitations or swelling in the hands or feet.   No other specific complaints in a complete review of systems (except as listed in HPI above).  Objective:   BP 120/70 mmHg  Pulse 96  Temp(Src) 97.8 F (36.6 C) (Oral)  Wt 166 lb (75.297 kg)  SpO2 98%  LMP 05/19/1993   Wt Readings from Last 3 Encounters:  04/03/15 166 lb (75.297 kg)  09/11/14 170 lb (77.111 kg)  06/06/14 172 lb 12.8 oz (78.382 kg)     General: Appears her stated age, well developed, well nourished in NAD. HEENT: Head: normal shape and size; Eyes: sclera white, no icterus, conjunctiva pink; Ears: Tm's gray and intact, normal light reflex; Nose: mucosa pink and moist, septum midline; Throat/Mouth: + PND. Teeth present, mucosa erythematous and moist, no exudate noted, no lesions or ulcerations noted.  Neck: Cervical lymphadenopathy.  Cardiovascular: Normal rate and rhythm. S1,S2 noted.  No murmur, rubs or gallops noted.  Pulmonary/Chest: Normal effort and positive vesicular breath sounds. No respiratory distress. No wheezes, rales or ronchi noted.      Assessment & Plan:   Upper Respiratory Infection:  Get some rest and drink plenty of water  Do salt water gargles for the sore throat eRx for Azithromax x 5 days eRx for Tessalon Pearls  RTC as needed or if symptoms persist.

## 2015-04-03 NOTE — Patient Instructions (Signed)

## 2015-04-11 ENCOUNTER — Encounter: Payer: Self-pay | Admitting: Emergency Medicine

## 2015-04-11 ENCOUNTER — Emergency Department
Admission: EM | Admit: 2015-04-11 | Discharge: 2015-04-11 | Disposition: A | Discharge status: Home / Self Care | Payer: Commercial Managed Care - HMO | Attending: Emergency Medicine | Admitting: Emergency Medicine

## 2015-04-11 ENCOUNTER — Telehealth: Payer: Self-pay

## 2015-04-11 DIAGNOSIS — S61210A Laceration without foreign body of right index finger without damage to nail, initial encounter: Secondary | ICD-10-CM | POA: Diagnosis not present

## 2015-04-11 DIAGNOSIS — E119 Type 2 diabetes mellitus without complications: Secondary | ICD-10-CM | POA: Diagnosis not present

## 2015-04-11 DIAGNOSIS — Y998 Other external cause status: Secondary | ICD-10-CM | POA: Insufficient documentation

## 2015-04-11 DIAGNOSIS — I10 Essential (primary) hypertension: Secondary | ICD-10-CM | POA: Diagnosis not present

## 2015-04-11 DIAGNOSIS — Y9289 Other specified places as the place of occurrence of the external cause: Secondary | ICD-10-CM | POA: Diagnosis not present

## 2015-04-11 DIAGNOSIS — W268XXA Contact with other sharp object(s), not elsewhere classified, initial encounter: Secondary | ICD-10-CM | POA: Insufficient documentation

## 2015-04-11 DIAGNOSIS — Z792 Long term (current) use of antibiotics: Secondary | ICD-10-CM | POA: Diagnosis not present

## 2015-04-11 DIAGNOSIS — Y9389 Activity, other specified: Secondary | ICD-10-CM | POA: Insufficient documentation

## 2015-04-11 DIAGNOSIS — Z79899 Other long term (current) drug therapy: Secondary | ICD-10-CM | POA: Insufficient documentation

## 2015-04-11 NOTE — Telephone Encounter (Signed)
PLEASE NOTE: All timestamps contained within this report are represented as [place] Standard Time. CONFIDENTIALTY NOTICE: This fax transmission is intended only for the addressee. It contains information that is legally privileged, confidential or otherwise protected from use or disclosure. If you are not the intended recipient, you are strictly prohibited from reviewing, disclosing, copying using or disseminating any of this information or taking any action in reliance on or regarding this information. If you have received this fax in error, please notify us immediately by telephone so that we can arrange for its return to [place]. Phone: [phone], Toll-Free: [phone], Fax: [phone] Page: 1 of 2 Call Id: [id number] [place] Patient Name: Sue Conway Gender: Female DOB: [birth year] Age: 69 Y 87 M 18 D Return Phone Number: [phone] (Primary) Address: City/State/Zip: [place] Client [place] [facility] - Client Client Site [place] - [facility] Phys[facility] [place] Contact Type Call Call Type Triage / Clinical Relationship To Patient Self Appointment Disposition EMR Appointment Not Necessary Info pasted into Epic No Return Phone Number [phone] (Primary) Chief Complaint Cuts and Lacerations Initial Comment caller states she cut her finger PreDisposition Call a family member Nurse Assessment Nurse: Chestine Spore, RN, [place] Date/Time (Eastern Time): 04/11/2015 1:21:23 PM Confirm and document reason for call. If symptomatic, describe symptoms. ---caller states cut index finger. bleeding and holding pressure Has the patient traveled out of the country within the last 30 days? ---No Does the patient have any new or worsening symptoms? ---Yes Will a triage be completed? ---Yes Related visit to physician within the last 2 weeks? ---No Does the PT have any  chronic conditions? (i.e. diabetes, asthma, etc.) ---No Is this a behavioral health call? ---No Guidelines Guideline Title Affirmed Question Affirmed Notes Nurse Date/Time (Eastern Time) Cuts and Lacerations [1] Bleeding AND [2] won't stop after 10 minutes of direct pressure (using correct technique) Matherly, RN, [place] 04/11/2015 1:23:20 PM Disp. Time Eilene Ghazi Time) Disposition Final User 04/11/2015 1:18:36 PM Send To Clinical Follow Up Jeral Fruit 04/11/2015 1:25:19 PM Go to ED Now Yes Chestine Spore, RN, Silvestre Moment Understands: Yes Disagree/Comply: Comply PLEASE NOTE: All timestamps contained within this report are represented as [place] Standard Time. CONFIDENTIALTY NOTICE: This fax transmission is intended only for the addressee. It contains information that is legally privileged, confidential or otherwise protected from use or disclosure. If you are not the intended recipient, you are strictly prohibited from reviewing, disclosing, copying using or disseminating any of this information or taking any action in reliance on or regarding this information. If you have received this fax in error, please notify us immediately by telephone so that we can arrange for its return to [place]. Phone: [phone], Toll-Free: [phone], Fax: [phone] Page: 2 of 2 Call Id: [id number] Care Advice Given Per Guideline GO TO ED NOW: You need to be seen in the Emergency Department. Go to the ER at ___________ [place] now. Drive carefully. BLEEDING: Continue direct pressure with a sterile gauze or clean cloth until seen. CARE ADVICE given per Cuts and Lacerations (Adult) guideline. After Care Instructions Given Call Event Type User Date / Time Description Referrals [facility] - ED

## 2015-04-11 NOTE — Discharge Instructions (Signed)
Laceration Care, Adult  A laceration is a cut that goes through all layers of the skin. The cut also goes into the tissue that is right under the skin. Some cuts heal on their own. Others need to be closed with stitches (sutures), staples, skin adhesive strips, or wound glue. Taking care of your cut lowers your risk of infection and helps your cut to heal better.  HOW TO TAKE CARE OF YOUR CUT  For stitches or staples:  · Keep the wound clean and dry.  · If you were given a bandage (dressing), you should change it at least one time per day or as told by your doctor. You should also change it if it gets wet or dirty.  · Keep the wound completely dry for the first 24 hours or as told by your doctor. After that time, you may take a shower or a bath. However, make sure that the wound is not soaked in water until after the stitches or staples have been removed.  · Clean the wound one time each day or as told by your doctor:    Wash the wound with soap and water.    Rinse the wound with water until all of the soap comes off.    Pat the wound dry with a clean towel. Do not rub the wound.  · After you clean the wound, put a thin layer of antibiotic ointment on it as told by your doctor. This ointment:    Helps to prevent infection.    Keeps the bandage from sticking to the wound.  · Have your stitches or staples removed as told by your doctor.  If your doctor used skin adhesive strips:   · Keep the wound clean and dry.  · If you were given a bandage, you should change it at least one time per day or as told by your doctor. You should also change it if it gets dirty or wet.  · Do not get the skin adhesive strips wet. You can take a shower or a bath, but be careful to keep the wound dry.  · If the wound gets wet, pat it dry with a clean towel. Do not rub the wound.  · Skin adhesive strips fall off on their own. You can trim the strips as the wound heals. Do not remove any strips that are still stuck to the wound. They will  fall off after a while.  If your doctor used wound glue:  · Try to keep your wound dry, but you may briefly wet it in the shower or bath. Do not soak the wound in water, such as by swimming.  · After you take a shower or a bath, gently pat the wound dry with a clean towel. Do not rub the wound.  · Do not do any activities that will make you really sweaty until the skin glue has fallen off on its own.  · Do not apply liquid, cream, or ointment medicine to your wound while the skin glue is still on.  · If you were given a bandage, you should change it at least one time per day or as told by your doctor. You should also change it if it gets dirty or wet.  · If a bandage is placed over the wound, do not let the tape for the bandage touch the skin glue.  · Do not pick at the glue. The skin glue usually stays on for 5-10 days. Then, it   falls off of the skin.  General Instructions   · To help prevent scarring, make sure to cover your wound with sunscreen whenever you are outside after stitches are removed, after adhesive strips are removed, or when wound glue stays in place and the wound is healed. Make sure to wear a sunscreen of at least 30 SPF.  · Take over-the-counter and prescription medicines only as told by your doctor.  · If you were given antibiotic medicine or ointment, take or apply it as told by your doctor. Do not stop using the antibiotic even if your wound is getting better.  · Do not scratch or pick at the wound.  · Keep all follow-up visits as told by your doctor. This is important.  · Check your wound every day for signs of infection. Watch for:    Redness, swelling, or pain.    Fluid, blood, or pus.  · Raise (elevate) the injured area above the level of your heart while you are sitting or lying down, if possible.  GET HELP IF:  · You got a tetanus shot and you have any of these problems at the injection site:    Swelling.    Very bad pain.    Redness.    Bleeding.  · You have a fever.  · A wound that was  closed breaks open.  · You notice a bad smell coming from your wound or your bandage.  · You notice something coming out of the wound, such as wood or glass.  · Medicine does not help your pain.  · You have more redness, swelling, or pain at the site of your wound.  · You have fluid, blood, or pus coming from your wound.  · You notice a change in the color of your skin near your wound.  · You need to change the bandage often because fluid, blood, or pus is coming from the wound.  · You start to have a new rash.  · You start to have numbness around the wound.  GET HELP RIGHT AWAY IF:  · You have very bad swelling around the wound.  · Your pain suddenly gets worse and is very bad.  · You notice painful lumps near the wound or on skin that is anywhere on your body.  · You have a red streak going away from your wound.  · The wound is on your hand or foot and you cannot move a finger or toe like you usually can.  · The wound is on your hand or foot and you notice that your fingers or toes look pale or bluish.     This information is not intended to replace advice given to you by your health care provider. Make sure you discuss any questions you have with your health care provider.     Document Released: 10/22/2007 Document Revised: 09/19/2014 Document Reviewed: 05/01/2014  Elsevier Interactive Patient Education ©2016 [employer]

## 2015-04-11 NOTE — ED Notes (Signed)
Pt was opening a can of vegetables cut left index finger, no bleeding noted
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
## 2015-04-11 NOTE — ED Notes (Addendum)
Patient presents to the ED with laceration to her forefinger of her right hand after cutting finger on a can lid.  Laceration is bleeding slightly, edges are completely approximated.  Laceration is about .25in. Long.

## 2015-04-18 ENCOUNTER — Ambulatory Visit (INDEPENDENT_AMBULATORY_CARE_PROVIDER_SITE_OTHER): Payer: Commercial Managed Care - HMO

## 2015-04-18 DIAGNOSIS — Z23 Encounter for immunization: Secondary | ICD-10-CM

## 2015-07-05 DIAGNOSIS — H524 Presbyopia: Secondary | ICD-10-CM | POA: Diagnosis not present

## 2015-07-05 DIAGNOSIS — H35033 Hypertensive retinopathy, bilateral: Secondary | ICD-10-CM | POA: Diagnosis not present

## 2015-07-05 DIAGNOSIS — H25813 Combined forms of age-related cataract, bilateral: Secondary | ICD-10-CM | POA: Diagnosis not present

## 2015-07-05 DIAGNOSIS — I10 Essential (primary) hypertension: Secondary | ICD-10-CM | POA: Diagnosis not present

## 2015-07-05 LAB — HM DIABETES EYE EXAM

## 2015-08-24 ENCOUNTER — Encounter: Payer: Commercial Managed Care - HMO | Admitting: Medical

## 2015-08-24 ENCOUNTER — Telehealth: Payer: Self-pay | Admitting: Medical

## 2015-08-24 ENCOUNTER — Telehealth: Payer: Self-pay

## 2015-08-24 DIAGNOSIS — R937 Abnormal findings on diagnostic imaging of other parts of musculoskeletal system: Secondary | ICD-10-CM | POA: Diagnosis not present

## 2015-08-24 DIAGNOSIS — M25562 Pain in left knee: Secondary | ICD-10-CM | POA: Diagnosis not present

## 2015-08-24 DIAGNOSIS — M1710 Unilateral primary osteoarthritis, unspecified knee: Secondary | ICD-10-CM | POA: Diagnosis not present

## 2015-08-24 DIAGNOSIS — I10 Essential (primary) hypertension: Secondary | ICD-10-CM | POA: Diagnosis not present

## 2015-08-24 NOTE — Telephone Encounter (Signed)
Vaughan Basta made pt an appt with another office and pt no-showed that appt., called pt and no answer so left voicemail requesting pt to call office back

## 2015-08-24 NOTE — Telephone Encounter (Signed)
Pt walked in; for 2 weeks pt has had left knee pain with swelling; no known injury but pt has been exercising more recently. Today pt had to usher at church and pt said the pain in lt knee is the worse it has ever been; pain level now is 6. Offered pt appt at another [facility] site but pt said that was too far to go; advised there is a [facility] at [employer] [facility] [employer]; pt is going out of town tomorrow; advised pt can go to UC to be evaluated. Pt said she would decide.FYI to Dr Glori Bickers.

## 2015-08-24 NOTE — Progress Notes (Signed)
This encounter was created in error - please disregard.

## 2015-08-24 NOTE — Telephone Encounter (Signed)
Thanks for letting me know -if there is an urgent care closer to her then that is a good idea  I apologize-we are down to just 2 providers this afternoon  In any case-elevate knee whenever possible and use a cold compress for 10 minutes on and off  Keep me posted

## 2015-08-27 ENCOUNTER — Telehealth: Payer: Self-pay | Admitting: Family Medicine

## 2015-08-27 DIAGNOSIS — M25562 Pain in left knee: Secondary | ICD-10-CM

## 2015-08-27 NOTE — Telephone Encounter (Signed)
No charge. 

## 2015-08-27 NOTE — Telephone Encounter (Signed)
Pt dropped off records and xrays from [facility]  cb number is [phone] Placing in rx tower  Thanks

## 2015-08-27 NOTE — Telephone Encounter (Signed)
All note said she was seen at [facility] and had her knee pain evaluated

## 2015-08-27 NOTE — Telephone Encounter (Signed)
Pt was no show 08/24/15 3:00pm for acute appt, 1st no show I see, pt of [facility], charge or no charge?

## 2015-08-27 NOTE — Telephone Encounter (Signed)
Over the weekend team health had problems with portal and will put copy of TH note at [facility] desk; pt returned call.

## 2015-08-28 NOTE — Telephone Encounter (Signed)
In Dr. Marliss Coots inbox for review when she returns

## 2015-08-28 NOTE — Telephone Encounter (Signed)
I will not be able to look at it until I return - if anything about that is pressing please let me know

## 2015-08-29 NOTE — Telephone Encounter (Signed)
Thanks-please check in with her and see how she is feeling- I will refer to ortho if needed

## 2015-08-29 NOTE — Telephone Encounter (Signed)
I reviewed her records and she had and xray of her left knee she gave [place] the CD with the images but the findings say:   Mild arthritic changes. Lucency in the upper outer aspect of the patella could be a bipartite patella or old fracture. No acute abnormality is demonstrated. Prominent spurring along the anterior superior and inferior margins of the patella. Impression: Mild arthritic changes, no acute findings  Her discharge instructions say she needs to f/u with an Ortho doc in one week if no improvement or sxs worsen or return to the UC, or f/u with PCP, and they gave her a brace to wear and advise her to take tylenol prn

## 2015-09-04 NOTE — Telephone Encounter (Signed)
Pt said her knee is still sore but its doing a lot better, pt declined a referral to Ortho doc for now she said if her sxs don't keep improving or if she starts to worsen again she will call back for referral to Ortho.

## 2015-09-12 NOTE — Telephone Encounter (Signed)
Pt left v/m requesting referral for ortho for knee.

## 2015-09-13 DIAGNOSIS — M25562 Pain in left knee: Secondary | ICD-10-CM | POA: Insufficient documentation

## 2015-09-13 NOTE — Addendum Note (Signed)
Addended by: Loura Pardon A on: 09/13/2015 08:16 AM   Modules accepted: Orders

## 2015-09-13 NOTE — Telephone Encounter (Signed)
Ortho ref Will route to [facility]

## 2015-09-17 DIAGNOSIS — M25562 Pain in left knee: Secondary | ICD-10-CM | POA: Diagnosis not present

## 2015-09-17 DIAGNOSIS — M1712 Unilateral primary osteoarthritis, left knee: Secondary | ICD-10-CM | POA: Diagnosis not present

## 2015-11-08 ENCOUNTER — Ambulatory Visit (INDEPENDENT_AMBULATORY_CARE_PROVIDER_SITE_OTHER): Payer: Commercial Managed Care - HMO

## 2015-11-08 VITALS — BP 122/70 | HR 72 | Temp 97.7°F | Ht 63.0 in | Wt 163.8 lb

## 2015-11-08 DIAGNOSIS — Z0000 Encounter for general adult medical examination without abnormal findings: Secondary | ICD-10-CM | POA: Diagnosis not present

## 2015-11-08 DIAGNOSIS — Z8639 Personal history of other endocrine, nutritional and metabolic disease: Secondary | ICD-10-CM

## 2015-11-08 DIAGNOSIS — E7800 Pure hypercholesterolemia, unspecified: Secondary | ICD-10-CM | POA: Diagnosis not present

## 2015-11-08 DIAGNOSIS — D649 Anemia, unspecified: Secondary | ICD-10-CM | POA: Diagnosis not present

## 2015-11-08 DIAGNOSIS — Z1231 Encounter for screening mammogram for malignant neoplasm of breast: Secondary | ICD-10-CM

## 2015-11-08 DIAGNOSIS — Z1159 Encounter for screening for other viral diseases: Secondary | ICD-10-CM

## 2015-11-08 DIAGNOSIS — Z6829 Body mass index (BMI) 29.0-29.9, adult: Secondary | ICD-10-CM

## 2015-11-08 LAB — COMPREHENSIVE METABOLIC PANEL
ALK PHOS: 74 U/L (ref 39–117)
ALT: 13 U/L (ref 0–35)
AST: 21 U/L (ref 0–37)
Albumin: 4.3 g/dL (ref 3.5–5.2)
BILIRUBIN TOTAL: 0.3 mg/dL (ref 0.2–1.2)
BUN: 14 mg/dL (ref 6–23)
CO2: 31 mEq/L (ref 19–32)
CREATININE: 0.77 mg/dL (ref 0.40–1.20)
Calcium: 9.6 mg/dL (ref 8.4–10.5)
Chloride: 105 mEq/L (ref 96–112)
GFR: 94.78 mL/min (ref >60.00)
GLUCOSE: 98 mg/dL (ref 70–99)
Potassium: 4.1 mEq/L (ref 3.5–5.1)
SODIUM: 140 meq/L (ref 135–145)
TOTAL PROTEIN: 7.4 g/dL (ref 6.0–8.3)

## 2015-11-08 LAB — LIPID PANEL
CHOLESTEROL: 202 mg/dL — AB (ref 0–200)
HDL: 60 mg/dL (ref >39.00)
LDL Cholesterol: 121 mg/dL — ABNORMAL HIGH (ref 0–99)
NONHDL: 141.65
Total CHOL/HDL Ratio: 3
Triglycerides: 102 mg/dL (ref 0.0–149.0)
VLDL: 20.4 mg/dL (ref 0.0–40.0)

## 2015-11-08 LAB — CBC WITH DIFFERENTIAL/PLATELET
BASOS ABS: 0 10*3/uL (ref 0.0–0.1)
Basophils Relative: 0.5 % (ref 0.0–3.0)
EOS PCT: 1.9 % (ref 0.0–5.0)
Eosinophils Absolute: 0.1 10*3/uL (ref 0.0–0.7)
HCT: 37.7 % (ref 36.0–46.0)
Hemoglobin: 12.1 g/dL (ref 12.0–15.0)
LYMPHS ABS: 1.8 10*3/uL (ref 0.7–4.0)
Lymphocytes Relative: 29.9 % (ref 12.0–46.0)
MCHC: 32.2 g/dL (ref 30.0–36.0)
MCV: 79.8 fl (ref 78.0–100.0)
MONO ABS: 0.4 10*3/uL (ref 0.1–1.0)
Monocytes Relative: 7.5 % (ref 3.0–12.0)
NEUTROS PCT: 60.2 % (ref 43.0–77.0)
Neutro Abs: 3.6 10*3/uL (ref 1.4–7.7)
Platelets: 270 10*3/uL (ref 150.0–400.0)
RBC: 4.73 Mil/uL (ref 3.87–5.11)
RDW: 15.4 % (ref 11.5–15.5)
WBC: 5.9 10*3/uL (ref 4.0–10.5)

## 2015-11-08 LAB — TSH: TSH: 1.08 u[IU]/mL (ref 0.35–4.50)

## 2015-11-08 LAB — HEMOGLOBIN A1C: HEMOGLOBIN A1C: 5.9 % (ref 4.6–6.5)

## 2015-11-08 NOTE — Progress Notes (Signed)
Pre visit review using our clinic review tool, if applicable. No additional management support is needed unless otherwise documented below in the visit note. 

## 2015-11-08 NOTE — Progress Notes (Signed)
Subjective:   Sue Conway is a 70 y.o. female who presents for Medicare Annual (Subsequent) preventive examination.  Review of Systems:  N/A Cardiac Risk Factors include: advanced age (>54men, >22 women);dyslipidemia;sedentary lifestyle     Objective:     Vitals: BP 122/70 mmHg  Pulse 72  Temp(Src) 97.7 F (36.5 C) (Oral)  Ht 5\' 3"  (1.6 m)  Wt 163 lb 12 oz (74.277 kg)  BMI 29.01 kg/m2  SpO2 94%  LMP 05/19/1993  Body mass index is 29.01 kg/(m^2).   Tobacco History  Smoking status  . Never Smoker   Smokeless tobacco  . Not on file     Counseling given: No   Past Medical History  Diagnosis Date  . Hyperlipidemia   . Elevated blood sugar level   . Diverticulosis of colon     BE mild diverticulosis 01/1995  . Osteopenia   . Ringing in ears     worse with aspirin  . Glaucoma 09/24/2005    history of glaucoma (Dr. Darolyn Rua)  . Chest pain   . Tinnitus    Past Surgical History  Procedure Laterality Date  . Abdominal hysterectomy  1999    total hysterectomy fibroids  . Cerebral aneurysm repair  02/2003  . Fine needle aspiration  06/2010    breast   Family History  Problem Relation Age of Onset  . Diabetes Mother   . Hypertension Mother   . Heart disease Mother     CHF  . Diabetes Father   . Diabetes Brother   . Heart disease Brother 14    CVA and MI   History  Sexual Activity  . Sexual Activity: No    Outpatient Encounter Prescriptions as of 11/08/2015  Medication Sig  . diphenhydrAMINE (SOMINEX) 25 MG tablet Take 25 mg by mouth at bedtime as needed for sleep.  . Multiple Vitamin (MULTI VITAMIN DAILY PO) Take 1 tablet by mouth.  . Cetirizine HCl (ZYRTEC PO) Take 1 tablet by mouth as needed. Reported on 11/08/2015  . fluticasone (FLONASE) 50 MCG/ACT nasal spray as needed. Reported on 11/08/2015  . hydrochlorothiazide (HYDRODIURIL) 25 MG tablet TAKE 1 TABLET BY MOUTH EVERY DAY (Patient not taking: Reported on 11/08/2015)  . ranitidine (ZANTAC) 150  MG capsule Take 1 capsule (150 mg total) by mouth 2 (two) times daily. (Patient not taking: Reported on 11/08/2015)  . [DISCONTINUED] azithromycin (ZITHROMAX) 250 MG tablet Take 2 tabs today, then 1 tab daily x 4 days  . [DISCONTINUED] benzonatate (TESSALON) 200 MG capsule Take 1 capsule (200 mg total) by mouth 3 (three) times daily as needed for cough.   No facility-administered encounter medications on file as of 11/08/2015.    Activities of Daily Living In your present state of health, do you have any difficulty performing the following activities: 11/08/2015  Hearing? N  Vision? N  Difficulty concentrating or making decisions? N  Walking or climbing stairs? N  Dressing or bathing? N  Doing errands, shopping? N  Preparing Food and eating ? N  Using the Toilet? N  In the past six months, have you accidently leaked urine? N  Do you have problems with loss of bowel control? N  Managing your Medications? N  Managing your Finances? N  Housekeeping or managing your Housekeeping? N    Patient Care Team: Abner Greenspan, MD as PCP - General Thelma Comp, OD as Referring Physician (Optometry)    Assessment:     Hearing Screening   125Hz  250Hz   500Hz  1000Hz  2000Hz  4000Hz  8000Hz   Right ear:   0 0 40 0   Left ear:   0 0 40 0   Vision Screening Comments: Last vision exam in Jan 2017   Exercise Activities and Dietary recommendations Current Exercise Habits: The patient does not participate in regular exercise at present, Exercise limited by: orthopedic condition(s)  Goals    . Increase physical activity     Starting 11/08/2015, I will begin doing at least 15 min of chair exercises daily.       Fall Risk Fall Risk  11/08/2015 06/06/2014  Falls in the past year? No No   Depression Screen PHQ 2/9 Scores 11/08/2015 06/06/2014  PHQ - 2 Score 0 2  PHQ- 9 Score - 6     Cognitive Testing MMSE - Mini Mental State Exam 11/08/2015  Orientation to time 5  Orientation to Place 5    Registration 3  Attention/ Calculation 0  Recall 3  Language- name 2 objects 0  Language- repeat 1  Language- follow 3 step command 3  Language- read & follow direction 0  Write a sentence 0  Copy design 0  Total score 20   PLEASE NOTE: A Mini-Cog screen was completed. Maximum score is 20. A value of 0 denotes this part of Folstein MMSE was not completed or the patient failed this part of the Mini-Cog screening.   Mini-Cog Screening Orientation to Time - Max 5 pts Orientation to Place - Max 5 pts Registration - Max 3 pts Recall - Max 3 pts Language Repeat - Max 1 pts Language Follow 3 Step Command - Max 3 pts   Immunization History  Administered Date(s) Administered  . Influenza,inj,Quad PF,36+ Mos 03/03/2014, 04/18/2015  . Pneumococcal Conjugate-13 06/06/2014  . Pneumococcal Polysaccharide-23 09/27/2010  . Td 12/17/2001   Screening Tests Health Maintenance  Topic Date Due  . COLON CANCER SCREENING ANNUAL FOBT  03/09/2016 (Originally 03/11/2015)  . MAMMOGRAM  05/18/2016 (Originally 06/16/2015)  . ZOSTAVAX  11/07/2016 (Originally 11/21/2003)  . TETANUS/TDAP  11/07/2016 (Originally 12/18/2011)  . INFLUENZA VACCINE  12/18/2015  . COLONOSCOPY  11/02/2017  . DEXA SCAN  Completed  . Hepatitis C Screening  Completed  . PNA vac Low Risk Adult  Completed      Plan:     I have personally reviewed and addressed the Medicare Annual Wellness questionnaire and have noted the following in the patient's chart:  A. Medical and social history B. Use of alcohol, tobacco or illicit drugs  C. Current medications and supplements D. Functional ability and status E.  Nutritional status F.  Physical activity G. Advance directives H. List of other physicians I.  Hospitalizations, surgeries, and ER visits in previous 12 months J.  [place] to include hearing, vision, cognitive, depression L. Referrals and appointments - none  In addition, I have reviewed and discussed with  patient certain preventive protocols, quality metrics, and best practice recommendations. A written personalized care plan for preventive services as well as general preventive health recommendations were provided to patient.  See attached scanned questionnaire for additional information.   Signed,   Lindell Noe, MHA, BS, LPN Health Advisor

## 2015-11-08 NOTE — Progress Notes (Signed)
PCP notes:  Health maintenance:  Mammogram - order generated; pt will schedule Tetanus - postponed/insurance Shingles - postponed/insurance Colon cancer screening - FOBT kit provided to pt Hep C screening - completed  Abnormal screenings:   Hearing - failed  Patient concerns:  Pt has complaint of intermittent left hip pain. Pt states onset was April 2017. Pt feels it may be related to fall in 01/2013. Pt takes OTC pain medication to manage pain.   Pt reports "roaring noise" in left ear. Pt feels her ears need to have earwax removed. Future appt scheduled.   Nurse concerns: None  Next PCP appt: 11/26/15 @ 1015

## 2015-11-08 NOTE — Patient Instructions (Signed)
Ms. Forti , Thank you for taking time to come for your Medicare Wellness Visit. I appreciate your ongoing commitment to your health goals. Please review the following plan we discussed and let me know if I can assist you in the future.   These are the goals we discussed: Goals    . Increase physical activity     Starting 11/08/2015, I will begin doing at least 15 min of chair exercises daily.        This is a list of the screening recommended for you and due dates:  Health Maintenance  Topic Date Due  . Stool Blood Test  03/09/2016*  . Mammogram  05/18/2016*  . Shingles Vaccine  11/07/2016*  . Tetanus Vaccine  11/07/2016*  . Flu Shot  12/18/2015  . Colon Cancer Screening  11/02/2017  . DEXA scan (bone density measurement)  Completed  .  Hepatitis C: One time screening is recommended by Center for Disease Control  (CDC) for  adults born from [birth year] through 1967.   Completed  . Pneumonia vaccines  Completed  *Topic was postponed. The date shown is not the original due date.    Preventive Care for Adults  A healthy lifestyle and preventive care can promote health and wellness. Preventive health guidelines for adults include the following key practices.  . A routine yearly physical is a good way to check with your health care provider about your health and preventive screening. It is a chance to share any concerns and updates on your health and to receive a thorough exam.  . Visit your dentist for a routine exam and preventive care every 6 months. Brush your teeth twice a day and floss once a day. Good oral hygiene prevents tooth decay and gum disease.  . The frequency of eye exams is based on your age, health, family medical history, use  of contact lenses, and other factors. Follow your health care provider's ecommendations for frequency of eye exams.  . Eat a healthy diet. Foods like vegetables, fruits, whole grains, low-fat dairy products, and lean protein foods contain the  nutrients you need without too many calories. Decrease your intake of foods high in solid fats, added sugars, and salt. Eat the right amount of calories for you. Get information about a proper diet from your health care provider, if necessary.  . Regular physical exercise is one of the most important things you can do for your health. Most adults should get at least 150 minutes of moderate-intensity exercise (any activity that increases your heart rate and causes you to sweat) each week. In addition, most adults need muscle-strengthening exercises on 2 or more days a week.  Silver Sneakers may be a benefit available to you. To determine eligibility, you may visit the website: www.silversneakers.com or contact program at (252)842-6114 Mon-Fri between 8AM-8PM.   . Maintain a healthy weight. The body mass index (BMI) is a screening tool to identify possible weight problems. It provides an estimate of body fat based on height and weight. Your health care provider can find your BMI and can help you achieve or maintain a healthy weight.   For adults 20 years and older: ? A BMI below 18.5 is considered underweight. ? A BMI of 18.5 to 24.9 is normal. ? A BMI of 25 to 29.9 is considered overweight. ? A BMI of 30 and above is considered obese.   . Maintain normal blood lipids and cholesterol levels by exercising and minimizing your intake of saturated fat.  Eat a balanced diet with plenty of fruit and vegetables. Blood tests for lipids and cholesterol should begin at age 33 and be repeated every 5 years. If your lipid or cholesterol levels are high, you are over 50, or you are at high risk for heart disease, you may need your cholesterol levels checked more frequently. Ongoing high lipid and cholesterol levels should be treated with medicines if diet and exercise are not working.  . If you smoke, find out from your health care provider how to quit. If you do not use tobacco, please do not start.  . If you  choose to drink alcohol, please do not consume more than 2 drinks per day. One drink is considered to be 12 ounces (355 mL) of beer, 5 ounces (148 mL) of wine, or 1.5 ounces (44 mL) of liquor.  . If you are 59-82 years old, ask your health care provider if you should take aspirin to prevent strokes.  . Use sunscreen. Apply sunscreen liberally and repeatedly throughout the day. You should seek shade when your shadow is shorter than you. Protect yourself by wearing long sleeves, pants, a wide-brimmed hat, and sunglasses year round, whenever you are outdoors.  . Once a month, do a whole body skin exam, using a mirror to look at the skin on your back. Tell your health care provider of new moles, moles that have irregular borders, moles that are larger than a pencil eraser, or moles that have changed in shape or color.

## 2015-11-09 LAB — HEPATITIS C ANTIBODY: HCV AB: NEGATIVE

## 2015-11-09 NOTE — Progress Notes (Signed)
I reviewed health advisor's note, was available for consultation, and agree with documentation and plan.  

## 2015-11-26 ENCOUNTER — Encounter: Payer: Self-pay | Admitting: Family Medicine

## 2015-11-26 ENCOUNTER — Ambulatory Visit (INDEPENDENT_AMBULATORY_CARE_PROVIDER_SITE_OTHER): Payer: Commercial Managed Care - HMO | Admitting: Family Medicine

## 2015-11-26 VITALS — BP 124/68 | HR 80 | Temp 98.2°F | Ht 64.0 in | Wt 164.2 lb

## 2015-11-26 DIAGNOSIS — J302 Other seasonal allergic rhinitis: Secondary | ICD-10-CM

## 2015-11-26 DIAGNOSIS — H9193 Unspecified hearing loss, bilateral: Secondary | ICD-10-CM | POA: Diagnosis not present

## 2015-11-26 DIAGNOSIS — E7800 Pure hypercholesterolemia, unspecified: Secondary | ICD-10-CM | POA: Diagnosis not present

## 2015-11-26 DIAGNOSIS — H9190 Unspecified hearing loss, unspecified ear: Secondary | ICD-10-CM | POA: Insufficient documentation

## 2015-11-26 DIAGNOSIS — H9313 Tinnitus, bilateral: Secondary | ICD-10-CM | POA: Diagnosis not present

## 2015-11-26 NOTE — Assessment & Plan Note (Signed)
Disc goals for lipids and reasons to control them Rev labs with pt Rev low sat fat diet in detail  Fairly stable with slt inc in LDL
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
## 2015-11-26 NOTE — Assessment & Plan Note (Signed)
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

## 2015-11-26 NOTE — Progress Notes (Signed)
Pre visit review using our clinic review tool, if applicable. No additional management support is needed unless otherwise documented below in the visit note. 
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
## 2015-11-26 NOTE — Assessment & Plan Note (Signed)
With chronic congestion despite daily flonase and antihistamine  Also chronic ear fullness  Ref to ENT for further eval Disc use of nasal saline spray or netti pot Disc avoidance of seasonal allergens if possible

## 2015-11-26 NOTE — Patient Instructions (Signed)
Stop at check out for ref to ENT doctor  Nasal saline is salt water nasal spray- use that several times daily (or a netti pot) - it helps flush out sinuses You are congested Do continue the flonase

## 2015-12-07 ENCOUNTER — Ambulatory Visit: Payer: Commercial Managed Care - HMO | Admitting: Family Medicine

## 2015-12-19 DIAGNOSIS — H6983 Other specified disorders of Eustachian tube, bilateral: Secondary | ICD-10-CM | POA: Diagnosis not present

## 2015-12-19 DIAGNOSIS — J309 Allergic rhinitis, unspecified: Secondary | ICD-10-CM | POA: Diagnosis not present

## 2015-12-31 ENCOUNTER — Encounter: Payer: Self-pay | Admitting: Family Medicine

## 2015-12-31 ENCOUNTER — Ambulatory Visit (INDEPENDENT_AMBULATORY_CARE_PROVIDER_SITE_OTHER): Payer: Commercial Managed Care - HMO | Admitting: Family Medicine

## 2015-12-31 VITALS — BP 102/56 | HR 79 | Temp 98.8°F | Ht 63.0 in | Wt 167.0 lb

## 2015-12-31 DIAGNOSIS — R739 Hyperglycemia, unspecified: Secondary | ICD-10-CM

## 2015-12-31 DIAGNOSIS — E7800 Pure hypercholesterolemia, unspecified: Secondary | ICD-10-CM

## 2015-12-31 DIAGNOSIS — Z0000 Encounter for general adult medical examination without abnormal findings: Secondary | ICD-10-CM | POA: Diagnosis not present

## 2015-12-31 NOTE — Assessment & Plan Note (Signed)
Disc goals for lipids and reasons to control them Rev labs with pt Rev low sat fat diet in detail Doing ok off statin Continue to follow
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
## 2015-12-31 NOTE — Assessment & Plan Note (Signed)
Lab Results  Component Value Date   HGBA1C 5.9 11/08/2015   Improved Disc low glycemic diet and wt control to prevent DM

## 2015-12-31 NOTE — Assessment & Plan Note (Signed)
Reviewed health habits including diet and exercise and skin cancer prevention Reviewed appropriate screening tests for age  Also reviewed health mt list, fam hx and immunization status , as well as social and family history   See HPI AMW reviewed Labs reviewed Please call [facility] to schedule your mammogram-if you are up to date let me know  If you are interested in a shingles/zoster vaccine - call your insurance to check on coverage,( you should not get it within 1 month of other vaccines) , then call us for a prescription  for it to take to a pharmacy that gives the shot , or make a nurse visit to get it here depending on your coverage Also see if your insurance would cover a tetanus shot in a pharmacy-see the handout I gave you  Get a flu shot in the fall as well  Try to get 1200-1500 mg of calcium per day with at least 1000 iu of vitamin D - for bone health (if you can not tolerate the calcium- at least take the vit D)  For cholesterol- Avoid red meat/ fried foods/ egg yolks/ fatty breakfast meats/ butter, cheese and high fat dairy/ and shellfish   Stay off of the hctz -your blood pressure is fine off of it  Cholesterol is fair off of cholesterol medicine Please do the stool kit Sue Conway gave you

## 2015-12-31 NOTE — Patient Instructions (Addendum)
Please call [facility] to schedule your mammogram-if you are up to date let me know  If you are interested in a shingles/zoster vaccine - call your insurance to check on coverage,( you should not get it within 1 month of other vaccines) , then call us for a prescription  for it to take to a pharmacy that gives the shot , or make a nurse visit to get it here depending on your coverage Also see if your insurance would cover a tetanus shot in a pharmacy-see the handout I gave you  Get a flu shot in the fall as well  Try to get 1200-1500 mg of calcium per day with at least 1000 iu of vitamin D - for bone health (if you can not tolerate the calcium- at least take the vit D)  For cholesterol- Avoid red meat/ fried foods/ egg yolks/ fatty breakfast meats/ butter, cheese and high fat dairy/ and shellfish   Stay off of the hctz -your blood pressure is fine off of it  Cholesterol is fair off of cholesterol medicine Please do the stool kit Lesia gave you    

## 2015-12-31 NOTE — Progress Notes (Signed)
Pre visit review using our clinic review tool, if applicable. No additional management support is needed unless otherwise documented below in the visit note. 

## 2016-01-11 ENCOUNTER — Ambulatory Visit
Admission: RE | Admit: 2016-01-11 | Discharge: 2016-01-11 | Disposition: A | Discharge status: Home / Self Care | Payer: Commercial Managed Care - HMO | Source: Ambulatory Visit | Attending: Primary Care | Admitting: Primary Care

## 2016-01-11 ENCOUNTER — Other Ambulatory Visit: Payer: Self-pay | Admitting: Primary Care

## 2016-01-11 DIAGNOSIS — Z1231 Encounter for screening mammogram for malignant neoplasm of breast: Secondary | ICD-10-CM | POA: Diagnosis not present

## 2016-07-10 DIAGNOSIS — H2513 Age-related nuclear cataract, bilateral: Secondary | ICD-10-CM | POA: Diagnosis not present

## 2016-07-10 DIAGNOSIS — H25013 Cortical age-related cataract, bilateral: Secondary | ICD-10-CM | POA: Diagnosis not present

## 2016-09-09 DIAGNOSIS — H02839 Dermatochalasis of unspecified eye, unspecified eyelid: Secondary | ICD-10-CM | POA: Diagnosis not present

## 2016-09-09 DIAGNOSIS — H25043 Posterior subcapsular polar age-related cataract, bilateral: Secondary | ICD-10-CM | POA: Diagnosis not present

## 2016-09-09 DIAGNOSIS — H2512 Age-related nuclear cataract, left eye: Secondary | ICD-10-CM | POA: Diagnosis not present

## 2016-09-09 DIAGNOSIS — H2513 Age-related nuclear cataract, bilateral: Secondary | ICD-10-CM | POA: Diagnosis not present

## 2016-09-09 DIAGNOSIS — H25013 Cortical age-related cataract, bilateral: Secondary | ICD-10-CM | POA: Diagnosis not present

## 2016-10-03 DIAGNOSIS — H43812 Vitreous degeneration, left eye: Secondary | ICD-10-CM | POA: Diagnosis not present

## 2016-10-06 DIAGNOSIS — Z961 Presence of intraocular lens: Secondary | ICD-10-CM | POA: Diagnosis not present

## 2016-10-06 DIAGNOSIS — H2512 Age-related nuclear cataract, left eye: Secondary | ICD-10-CM | POA: Diagnosis not present

## 2016-10-07 DIAGNOSIS — H2511 Age-related nuclear cataract, right eye: Secondary | ICD-10-CM | POA: Diagnosis not present

## 2016-10-09 ENCOUNTER — Ambulatory Visit (INDEPENDENT_AMBULATORY_CARE_PROVIDER_SITE_OTHER): Payer: Medicare HMO | Admitting: Internal Medicine

## 2016-10-09 ENCOUNTER — Encounter: Payer: Self-pay | Admitting: Internal Medicine

## 2016-10-09 DIAGNOSIS — R030 Elevated blood-pressure reading, without diagnosis of hypertension: Secondary | ICD-10-CM | POA: Diagnosis not present

## 2016-10-09 MED ORDER — AMLODIPINE BESYLATE 5 MG PO TABS: 5.0000 mg | ORAL_TABLET | Freq: Every day | ORAL | 0 refills | Status: DC

## 2016-10-09 NOTE — Progress Notes (Signed)
   Subjective:    Patient ID: Sue Conway, female    DOB: 1945/10/08, 71 y.o.   MRN: [id number]  HPI The patient is a 71 YO female coming in for high blood pressure since eye surgery. She had cataract surgery on Monday and they put her on 3 eye drops. She has brought the bottles with her today so that we know what they are. She is having mild headache with the high blood pressure. No chest pains, SOB, abdominal pain, nausea, vomiting, confusion. Denies vision changes or weakness or numbness. She previously was on blood pressure years ago but has not needed it since that time.   Review of Systems  Constitutional: Negative for activity change, appetite change, fatigue, fever and unexpected weight change.  HENT: Negative.   Eyes: Negative.   Respiratory: Negative.   Cardiovascular: Negative.   Gastrointestinal: Negative.   Musculoskeletal: Negative.   Neurological: Positive for headaches.      Objective:   Physical Exam  Constitutional: She is oriented to person, place, and time. She appears well-developed and well-nourished.  HENT:  Head: Normocephalic and atraumatic.  Eyes: EOM are normal.  Neck: Normal range of motion.  Cardiovascular: Normal rate and regular rhythm.   Pulmonary/Chest: Effort normal and breath sounds normal. No respiratory distress. She has no wheezes. She has no rales.  Abdominal: Soft. She exhibits no distension. There is no tenderness. There is no rebound.  Neurological: She is alert and oriented to person, place, and time. No cranial nerve deficit. Coordination normal.  Skin: Skin is warm and dry.   Vitals:   10/09/16 1601 10/09/16 1625  BP: (!) 170/90 (!) 158/86  Pulse: 81   Resp: 12   Temp: 98.6 F (37 C)   TempSrc: Oral   SpO2: 99%   Weight: 166 lb (75.3 kg)   Height: 5\' 3"  (1.6 m)       Assessment & Plan:

## 2016-10-09 NOTE — Assessment & Plan Note (Signed)
Previously on hctz for blood pressure. Suspect the NSAID and prednisone eye drops are causing the high blood pressures. Rx for amlodipine today to control while on the eye drops as she has another eye to get done afterwards. She will follow up with PCP after off drops to assess need for ongoing treatment.

## 2016-10-09 NOTE — Patient Instructions (Signed)
We have sent in a medicine for blood pressure called amlodipine that you take 1 pill daily.  It is likely that the eye drops are causing the blood pressure to be up some.   See Dr. Glori Bickers back again after you are off the drops to see if you still need the blood pressure medicine.

## 2016-11-05 ENCOUNTER — Other Ambulatory Visit: Payer: Self-pay | Admitting: Internal Medicine

## 2016-11-05 NOTE — Telephone Encounter (Signed)
Patient advised.

## 2016-11-05 NOTE — Telephone Encounter (Signed)
Routing to greg, dr crawford did not give refills on this med when it was started at [facility]---are you ok with continuing med or do you need patient to have follow up--please advise, thanks

## 2016-11-05 NOTE — Telephone Encounter (Signed)
Recommend follow up with Dr. Glori Bickers to determine continued need for medication.

## 2016-11-29 ENCOUNTER — Other Ambulatory Visit: Payer: Self-pay | Admitting: Family Medicine

## 2016-12-01 DIAGNOSIS — H2511 Age-related nuclear cataract, right eye: Secondary | ICD-10-CM | POA: Diagnosis not present

## 2016-12-01 DIAGNOSIS — Z961 Presence of intraocular lens: Secondary | ICD-10-CM | POA: Diagnosis not present

## 2016-12-01 NOTE — Telephone Encounter (Signed)
Please schedule f/u this summer and refill until then

## 2016-12-01 NOTE — Telephone Encounter (Signed)
Rx was filled on 10/09/16 by another provider at an acute appt. The check out paper said pt was to f/u with you after that but I don't see she has f/u with you. Do you want me to fill Rx, please advise

## 2016-12-02 NOTE — Telephone Encounter (Signed)
appt scheduled and med refilled 

## 2016-12-19 ENCOUNTER — Encounter: Payer: Self-pay | Admitting: Family Medicine

## 2016-12-19 ENCOUNTER — Ambulatory Visit (INDEPENDENT_AMBULATORY_CARE_PROVIDER_SITE_OTHER): Payer: Medicare HMO | Admitting: Family Medicine

## 2016-12-19 VITALS — BP 122/65 | HR 82 | Temp 98.1°F | Ht 63.0 in | Wt 166.8 lb

## 2016-12-19 DIAGNOSIS — I10 Essential (primary) hypertension: Secondary | ICD-10-CM

## 2016-12-19 LAB — COMPREHENSIVE METABOLIC PANEL
ALBUMIN: 4.2 g/dL (ref 3.5–5.2)
ALK PHOS: 77 U/L (ref 39–117)
ALT: 14 U/L (ref 0–35)
AST: 20 U/L (ref 0–37)
BILIRUBIN TOTAL: 0.3 mg/dL (ref 0.2–1.2)
BUN: 13 mg/dL (ref 6–23)
CO2: 29 mEq/L (ref 19–32)
Calcium: 9.5 mg/dL (ref 8.4–10.5)
Chloride: 105 mEq/L (ref 96–112)
Creatinine, Ser: 0.82 mg/dL (ref 0.40–1.20)
GFR: 87.86 mL/min (ref >60.00)
GLUCOSE: 93 mg/dL (ref 70–99)
POTASSIUM: 4.1 meq/L (ref 3.5–5.1)
Sodium: 140 mEq/L (ref 135–145)
TOTAL PROTEIN: 7.4 g/dL (ref 6.0–8.3)

## 2016-12-19 LAB — CBC WITH DIFFERENTIAL/PLATELET
BASOS ABS: 0.1 10*3/uL (ref 0.0–0.1)
Basophils Relative: 1.2 % (ref 0.0–3.0)
Eosinophils Absolute: 0.1 10*3/uL (ref 0.0–0.7)
Eosinophils Relative: 1.9 % (ref 0.0–5.0)
HCT: 38.2 % (ref 36.0–46.0)
HEMOGLOBIN: 12.2 g/dL (ref 12.0–15.0)
LYMPHS PCT: 30.1 % (ref 12.0–46.0)
Lymphs Abs: 1.9 10*3/uL (ref 0.7–4.0)
MCHC: 32.1 g/dL (ref 30.0–36.0)
MCV: 81.9 fl (ref 78.0–100.0)
MONOS PCT: 8 % (ref 3.0–12.0)
Monocytes Absolute: 0.5 10*3/uL (ref 0.1–1.0)
NEUTROS PCT: 58.8 % (ref 43.0–77.0)
Neutro Abs: 3.8 10*3/uL (ref 1.4–7.7)
Platelets: 266 10*3/uL (ref 150.0–400.0)
RBC: 4.66 Mil/uL (ref 3.87–5.11)
RDW: 15.5 % (ref 11.5–15.5)
WBC: 6.4 10*3/uL (ref 4.0–10.5)

## 2016-12-19 LAB — LIPID PANEL
CHOLESTEROL: 196 mg/dL (ref 0–200)
HDL: 56.2 mg/dL (ref >39.00)
LDL Cholesterol: 110 mg/dL — ABNORMAL HIGH (ref 0–99)
NONHDL: 139.61
Total CHOL/HDL Ratio: 3
Triglycerides: 147 mg/dL (ref 0.0–149.0)
VLDL: 29.4 mg/dL (ref 0.0–40.0)

## 2016-12-19 LAB — TSH: TSH: 1.22 u[IU]/mL (ref 0.35–4.50)

## 2016-12-19 MED ORDER — AMLODIPINE BESYLATE 5 MG PO TABS: 5.0000 mg | ORAL_TABLET | Freq: Every day | ORAL | 11 refills | Status: DC

## 2016-12-19 NOTE — Patient Instructions (Signed)
Blood pressure is better  Take care of yourself Think about an exercise program   Labs today
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
## 2016-12-21 NOTE — Assessment & Plan Note (Signed)
bp in fair control at this time   (improved)  BP Readings from Last 1 Encounters:  12/19/16 122/65   No changes needed Disc lifstyle change with low sodium diet and exercise  Labs today

## 2016-12-22 ENCOUNTER — Encounter: Payer: Self-pay | Admitting: *Deleted

## 2017-01-23 DIAGNOSIS — H2513 Age-related nuclear cataract, bilateral: Secondary | ICD-10-CM | POA: Diagnosis not present

## 2017-01-23 DIAGNOSIS — H2512 Age-related nuclear cataract, left eye: Secondary | ICD-10-CM | POA: Diagnosis not present

## 2017-01-23 DIAGNOSIS — H2511 Age-related nuclear cataract, right eye: Secondary | ICD-10-CM | POA: Diagnosis not present

## 2017-01-23 DIAGNOSIS — Z961 Presence of intraocular lens: Secondary | ICD-10-CM | POA: Diagnosis not present

## 2017-01-23 DIAGNOSIS — H5213 Myopia, bilateral: Secondary | ICD-10-CM | POA: Diagnosis not present

## 2017-01-26 ENCOUNTER — Ambulatory Visit (INDEPENDENT_AMBULATORY_CARE_PROVIDER_SITE_OTHER): Payer: Medicare HMO | Admitting: Family Medicine

## 2017-01-26 ENCOUNTER — Encounter: Payer: Self-pay | Admitting: Family Medicine

## 2017-01-26 VITALS — BP 138/68 | HR 83 | Temp 98.2°F | Ht 63.0 in | Wt 167.8 lb

## 2017-01-26 DIAGNOSIS — T148XXA Other injury of unspecified body region, initial encounter: Secondary | ICD-10-CM

## 2017-01-26 MED ORDER — CEPHALEXIN 500 MG PO CAPS: 500.0000 mg | ORAL_CAPSULE | Freq: Three times a day (TID) | ORAL | 0 refills | Status: DC

## 2017-01-26 NOTE — Assessment & Plan Note (Signed)
L posterior ankle 2 cm - pt suspects in response to insect bite   Tiny blister superior to it resembles insect bite) Straw colored fluid expressed and wound cx obt  To cover for poss bact infection- keflex px  inst to watch for pain /redness/ swelling and update   Further eval if recurrent   Wound inst in avs
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
## 2017-01-26 NOTE — Patient Instructions (Signed)
Keep wound areas clean with soap and water  Antibiotic ointment over the counter is fine  Dress with loose non stick bandage  Take the keflex as directed   If increased redness/pain/size or any fever - alert [place] immediately

## 2017-01-28 ENCOUNTER — Encounter: Payer: Self-pay | Admitting: Family Medicine

## 2017-01-28 ENCOUNTER — Ambulatory Visit (INDEPENDENT_AMBULATORY_CARE_PROVIDER_SITE_OTHER): Payer: Medicare HMO | Admitting: Family Medicine

## 2017-01-28 ENCOUNTER — Telehealth: Payer: Self-pay

## 2017-01-28 VITALS — BP 110/64 | HR 86 | Temp 98.6°F | Ht 63.0 in | Wt 166.5 lb

## 2017-01-28 DIAGNOSIS — T148XXA Other injury of unspecified body region, initial encounter: Secondary | ICD-10-CM | POA: Diagnosis not present

## 2017-01-28 MED ORDER — TRIAMCINOLONE ACETONIDE 0.5 % EX CREA: 1.0000 "application " | TOPICAL_CREAM | Freq: Two times a day (BID) | CUTANEOUS | 0 refills | Status: DC

## 2017-01-28 NOTE — Telephone Encounter (Signed)
DR. Glori Bickers had a 5:15 that cancelled and pt wants to be seen at that time. Mandy scheduled appt
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
## 2017-01-28 NOTE — Patient Instructions (Signed)
Your blister looks like it is healing and does not look infected  Finish your keflex (antibiotic)  Keep clean with soap and water  Use the triamcinolone cream twice daily   Update if not starting to improve in a week or if worsening

## 2017-01-28 NOTE — Telephone Encounter (Signed)
Pt was seen 01/26/17 and today pt said blister does not look good; the blister appears like collecting more fluid than when seen on 01/26/17; not draining but feels sore; less redness than when seen; pt request cb with what to do. [facility].

## 2017-01-28 NOTE — Telephone Encounter (Signed)
Any fever or streaking of redness?  Is she feeling ok? Are there other blisters present ?

## 2017-01-28 NOTE — Telephone Encounter (Signed)
Can you put her in for 6 pm ? So I can check it

## 2017-01-28 NOTE — Telephone Encounter (Signed)
No fever, no red streaking, it's a little redness around the outside of the blister but no much. No new blisters but the one blister Dr. Glori Bickers looked @ is starting to re-fill with fluid

## 2017-01-29 LAB — WOUND CULTURE
MICRO NUMBER: 80993651
RESULT:: NO GROWTH
SPECIMEN QUALITY: ADEQUATE

## 2017-01-29 NOTE — Assessment & Plan Note (Signed)
Much improved  No longer erythematous  Will finish keflex  Px triamcinolone cream 0.5% for use bid  ? If possibly allergic vs bullous pemphigoid  Continue to watch closely

## 2017-03-16 ENCOUNTER — Other Ambulatory Visit: Payer: Self-pay | Admitting: Family Medicine

## 2017-03-16 DIAGNOSIS — Z1231 Encounter for screening mammogram for malignant neoplasm of breast: Secondary | ICD-10-CM

## 2017-03-31 ENCOUNTER — Ambulatory Visit
Admission: RE | Admit: 2017-03-31 | Discharge: 2017-03-31 | Disposition: A | Discharge status: Home / Self Care | Payer: Medicare HMO | Source: Ambulatory Visit | Attending: Family Medicine | Admitting: Family Medicine

## 2017-03-31 DIAGNOSIS — Z1231 Encounter for screening mammogram for malignant neoplasm of breast: Secondary | ICD-10-CM | POA: Insufficient documentation

## 2017-04-27 ENCOUNTER — Ambulatory Visit: Payer: Medicare HMO

## 2017-05-04 ENCOUNTER — Ambulatory Visit (INDEPENDENT_AMBULATORY_CARE_PROVIDER_SITE_OTHER): Payer: Medicare HMO | Admitting: Family Medicine

## 2017-05-04 ENCOUNTER — Encounter: Payer: Self-pay | Admitting: Family Medicine

## 2017-05-04 VITALS — BP 130/68 | HR 70 | Temp 97.9°F | Ht 63.25 in | Wt 169.8 lb

## 2017-05-04 DIAGNOSIS — M8588 Other specified disorders of bone density and structure, other site: Secondary | ICD-10-CM | POA: Diagnosis not present

## 2017-05-04 DIAGNOSIS — E7800 Pure hypercholesterolemia, unspecified: Secondary | ICD-10-CM | POA: Diagnosis not present

## 2017-05-04 DIAGNOSIS — I10 Essential (primary) hypertension: Secondary | ICD-10-CM

## 2017-05-04 DIAGNOSIS — E2839 Other primary ovarian failure: Secondary | ICD-10-CM

## 2017-05-04 DIAGNOSIS — J3089 Other allergic rhinitis: Secondary | ICD-10-CM

## 2017-05-04 DIAGNOSIS — Z1211 Encounter for screening for malignant neoplasm of colon: Secondary | ICD-10-CM | POA: Diagnosis not present

## 2017-05-04 DIAGNOSIS — R739 Hyperglycemia, unspecified: Secondary | ICD-10-CM

## 2017-05-04 DIAGNOSIS — Z0000 Encounter for general adult medical examination without abnormal findings: Secondary | ICD-10-CM

## 2017-05-04 LAB — COMPREHENSIVE METABOLIC PANEL
ALK PHOS: 75 U/L (ref 39–117)
ALT: 13 U/L (ref 0–35)
AST: 18 U/L (ref 0–37)
Albumin: 4 g/dL (ref 3.5–5.2)
BILIRUBIN TOTAL: 0.4 mg/dL (ref 0.2–1.2)
BUN: 13 mg/dL (ref 6–23)
CALCIUM: 9.2 mg/dL (ref 8.4–10.5)
CO2: 30 meq/L (ref 19–32)
Chloride: 105 mEq/L (ref 96–112)
Creatinine, Ser: 0.68 mg/dL (ref 0.40–1.20)
GFR: 108.94 mL/min (ref >60.00)
GLUCOSE: 85 mg/dL (ref 70–99)
POTASSIUM: 3.6 meq/L (ref 3.5–5.1)
Sodium: 141 mEq/L (ref 135–145)
Total Protein: 7.2 g/dL (ref 6.0–8.3)

## 2017-05-04 LAB — CBC WITH DIFFERENTIAL/PLATELET
BASOS ABS: 0.1 10*3/uL (ref 0.0–0.1)
Basophils Relative: 0.9 % (ref 0.0–3.0)
Eosinophils Absolute: 0.1 10*3/uL (ref 0.0–0.7)
Eosinophils Relative: 1.6 % (ref 0.0–5.0)
HEMATOCRIT: 36.3 % (ref 36.0–46.0)
Hemoglobin: 11.6 g/dL — ABNORMAL LOW (ref 12.0–15.0)
LYMPHS ABS: 1.7 10*3/uL (ref 0.7–4.0)
LYMPHS PCT: 29.6 % (ref 12.0–46.0)
MCHC: 32.1 g/dL (ref 30.0–36.0)
MCV: 82.1 fl (ref 78.0–100.0)
MONOS PCT: 7.1 % (ref 3.0–12.0)
Monocytes Absolute: 0.4 10*3/uL (ref 0.1–1.0)
NEUTROS ABS: 3.5 10*3/uL (ref 1.4–7.7)
NEUTROS PCT: 60.8 % (ref 43.0–77.0)
PLATELETS: 262 10*3/uL (ref 150.0–400.0)
RBC: 4.42 Mil/uL (ref 3.87–5.11)
RDW: 15.7 % — ABNORMAL HIGH (ref 11.5–15.5)
WBC: 5.8 10*3/uL (ref 4.0–10.5)

## 2017-05-04 LAB — TSH: TSH: 1.37 u[IU]/mL (ref 0.35–4.50)

## 2017-05-04 LAB — LIPID PANEL
CHOL/HDL RATIO: 3
Cholesterol: 177 mg/dL (ref 0–200)
HDL: 59.1 mg/dL (ref >39.00)
LDL Cholesterol: 94 mg/dL (ref 0–99)
NonHDL: 118.38
TRIGLYCERIDES: 124 mg/dL (ref 0.0–149.0)
VLDL: 24.8 mg/dL (ref 0.0–40.0)

## 2017-05-04 LAB — HEMOGLOBIN A1C: Hgb A1c MFr Bld: 6.1 % (ref 4.6–6.5)

## 2017-05-04 MED ORDER — FLUTICASONE PROPIONATE 50 MCG/ACT NA SUSP: 2.0000 | Freq: Every day | NASAL | 11 refills | Status: DC

## 2017-05-04 MED ORDER — AMLODIPINE BESYLATE 5 MG PO TABS: 5.0000 mg | ORAL_TABLET | Freq: Every day | ORAL | 11 refills | Status: DC
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
## 2017-05-04 NOTE — Assessment & Plan Note (Signed)
Rev dexa 3/16 Due for 2 y f/u-ref done  Disc imp of ca and D No falls or fx

## 2017-05-04 NOTE — Assessment & Plan Note (Signed)
Disc goals for lipids and reasons to control them Rev labs with pt Rev low sat fat diet in detail Labs today  Intolerant of statin in the past  Diet is fair

## 2017-05-04 NOTE — Assessment & Plan Note (Signed)
bp in fair control at this time  BP Readings from Last 1 Encounters:  05/04/17 130/68   No changes needed Disc lifstyle change with low sodium diet and exercise  Labs today  Disc need for wt loss

## 2017-05-04 NOTE — Assessment & Plan Note (Signed)
Will be due for colonoscopy in 2019- pt aware

## 2017-05-04 NOTE — Assessment & Plan Note (Signed)
dexa ref done

## 2017-05-04 NOTE — Assessment & Plan Note (Signed)
Will add flonase for her pnd and chronic congestion  If no improvement she is interested in allergist consult  Adv to avoid allergens when possible

## 2017-05-04 NOTE — Assessment & Plan Note (Signed)
Reviewed health habits including diet and exercise and skin cancer prevention Reviewed appropriate screening tests for age  Also reviewed health mt list, fam hx and immunization status , as well as social and family history   See HPI Labs reviewed  Does not have amw visit   your coloscopy is due in June 2019   Try to get 1200-1500 mg of calcium per day with at least 1000 iu of vitamin D - for bone health  This is important!!!  We will refer you for a bone density test at check out   Due for labs today   Aim for exercise 30 minutes daily -indoors or out (at least 5 days per week)

## 2017-05-04 NOTE — Patient Instructions (Addendum)
Let's get you back on flonase daily (all year) to see how it helps with ongoing sinus congestion   your coloscopy is due in June 2019   Try to get 1200-1500 mg of calcium per day with at least 1000 iu of vitamin D - for bone health  This is important!!!  We will refer you for a bone density test at check out   Due for labs today   Aim for exercise 30 minutes daily -indoors or out (at least 5 days per week)

## 2017-05-04 NOTE — Assessment & Plan Note (Signed)
Lab today for A1C Last one 5.9 disc imp of low glycemic diet and wt loss to prevent DM2

## 2017-05-05 ENCOUNTER — Encounter: Payer: Self-pay | Admitting: *Deleted

## 2017-07-21 ENCOUNTER — Ambulatory Visit
Admission: RE | Admit: 2017-07-21 | Discharge: 2017-07-21 | Disposition: A | Discharge status: Home / Self Care | Payer: Medicare HMO | Source: Ambulatory Visit | Attending: Family Medicine | Admitting: Family Medicine

## 2017-07-21 DIAGNOSIS — E2839 Other primary ovarian failure: Secondary | ICD-10-CM | POA: Diagnosis not present

## 2017-07-21 DIAGNOSIS — M8588 Other specified disorders of bone density and structure, other site: Secondary | ICD-10-CM | POA: Diagnosis not present

## 2017-07-21 DIAGNOSIS — Z780 Asymptomatic menopausal state: Secondary | ICD-10-CM | POA: Diagnosis not present

## 2017-07-27 ENCOUNTER — Encounter: Payer: Self-pay | Admitting: *Deleted

## 2017-08-24 DIAGNOSIS — H3561 Retinal hemorrhage, right eye: Secondary | ICD-10-CM | POA: Diagnosis not present

## 2017-08-24 DIAGNOSIS — H43811 Vitreous degeneration, right eye: Secondary | ICD-10-CM | POA: Diagnosis not present

## 2017-09-10 DIAGNOSIS — H43811 Vitreous degeneration, right eye: Secondary | ICD-10-CM | POA: Diagnosis not present

## 2017-10-06 ENCOUNTER — Encounter: Payer: Self-pay | Admitting: Gastroenterology

## 2017-10-30 ENCOUNTER — Encounter: Payer: Self-pay | Admitting: Gastroenterology

## 2017-10-30 ENCOUNTER — Telehealth: Payer: Self-pay

## 2017-10-30 NOTE — Telephone Encounter (Signed)
Copied from [place] [id number]. Topic: General - Other >> Oct 30, 2017  9:57 AM Lennox Solders wrote: Reason for CRM:pt is calling to let md know she had cologuard test and they found blood in her stool and she will call her GI md to schedule colonoscopy

## 2017-10-30 NOTE — Telephone Encounter (Signed)
Thanks - let us know if she ends up needing a referral

## 2017-11-27 ENCOUNTER — Encounter: Payer: Self-pay | Admitting: Family Medicine

## 2017-11-27 ENCOUNTER — Ambulatory Visit (INDEPENDENT_AMBULATORY_CARE_PROVIDER_SITE_OTHER): Payer: Medicare HMO | Admitting: Family Medicine

## 2017-11-27 VITALS — BP 118/70 | HR 91 | Temp 98.3°F | Ht 63.25 in | Wt 172.0 lb

## 2017-11-27 DIAGNOSIS — L255 Unspecified contact dermatitis due to plants, except food: Secondary | ICD-10-CM | POA: Diagnosis not present

## 2017-11-27 MED ORDER — TRIAMCINOLONE ACETONIDE 0.1 % EX CREA: 1.0000 "application " | TOPICAL_CREAM | Freq: Two times a day (BID) | CUTANEOUS | 0 refills | Status: DC

## 2017-11-27 NOTE — Patient Instructions (Addendum)
Keep the rash cool with cold compresses if needed  Wash with soap and water (cool)  Use the triamcinolone cream twice daily  Also get back on zyrtec 10 mg once daily for itch -it will help   If worse/red/swelling- call and let us know   Be aware of poison ivy- try not to get out in it  Also wash clothes/wipe down shoes you wore the day you were out

## 2017-11-27 NOTE — Assessment & Plan Note (Signed)
R forearm  After working on yard  Disc washing clothing that may have touched plant  Cool compress Zyrtec  Triamcinolone cream bid  Update if not starting to improve in a week or if worsening
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
## 2017-11-30 ENCOUNTER — Ambulatory Visit (AMBULATORY_SURGERY_CENTER): Payer: Self-pay | Admitting: *Deleted

## 2017-11-30 ENCOUNTER — Other Ambulatory Visit: Payer: Self-pay

## 2017-11-30 VITALS — Ht 63.0 in | Wt 171.2 lb

## 2017-11-30 DIAGNOSIS — Z1211 Encounter for screening for malignant neoplasm of colon: Secondary | ICD-10-CM

## 2017-11-30 MED ORDER — NA SULFATE-K SULFATE-MG SULF 17.5-3.13-1.6 GM/177ML PO SOLN: 1.0000 | Freq: Once | ORAL | 0 refills | Status: AC

## 2017-11-30 NOTE — Progress Notes (Signed)
No egg or soy allergy known to patient  No issues with past sedation with any surgeries  or procedures, no intubation problems  No diet pills per patient No home 02 use per patient  No blood thinners per patient  Pt denies issues with constipation  No A fib or A flutter  EMMI video sent to pt's e mail pt declined   

## 2017-12-03 ENCOUNTER — Encounter: Payer: Self-pay | Admitting: Gastroenterology

## 2017-12-15 ENCOUNTER — Telehealth: Payer: Self-pay | Admitting: Gastroenterology

## 2017-12-15 NOTE — Telephone Encounter (Signed)
Called pt back and advised her to follow instructions strictly from here on out which means clear liquids only and complete all of prep.  Pt agreed. Angela/Admitting

## 2017-12-15 NOTE — Telephone Encounter (Signed)
Patient states she misunderstood the instructions and ate lunch today. Patient wants to know if she can still have procedure done tomorrow 7.31.19.

## 2017-12-15 NOTE — Telephone Encounter (Signed)
Ok, thank you. Agree, continue with only clear liquids and do the bowel prep as commented instructed

## 2017-12-16 ENCOUNTER — Ambulatory Visit (AMBULATORY_SURGERY_CENTER): Payer: Medicare HMO | Admitting: Gastroenterology

## 2017-12-16 ENCOUNTER — Encounter: Payer: Self-pay | Admitting: Gastroenterology

## 2017-12-16 VITALS — BP 130/71 | HR 97 | Temp 98.0°F | Resp 15 | Ht 63.0 in | Wt 172.0 lb

## 2017-12-16 DIAGNOSIS — Z86010 Personal history of colonic polyps: Secondary | ICD-10-CM | POA: Diagnosis not present

## 2017-12-16 DIAGNOSIS — D122 Benign neoplasm of ascending colon: Secondary | ICD-10-CM

## 2017-12-16 DIAGNOSIS — I10 Essential (primary) hypertension: Secondary | ICD-10-CM | POA: Diagnosis not present

## 2017-12-16 DIAGNOSIS — Z1211 Encounter for screening for malignant neoplasm of colon: Secondary | ICD-10-CM | POA: Diagnosis not present

## 2017-12-16 DIAGNOSIS — E7800 Pure hypercholesterolemia, unspecified: Secondary | ICD-10-CM | POA: Diagnosis not present

## 2017-12-16 MED ORDER — SODIUM CHLORIDE 0.9 % IV SOLN: 500.0000 mL | Freq: Once | INTRAVENOUS | Status: DC

## 2017-12-16 NOTE — Patient Instructions (Signed)
HANDOUTS GIVEN FOR POLYPS, DIVERTICULOSIS AND HEMORRHOIDS  YOU HAD AN ENDOSCOPIC PROCEDURE TODAY AT THE [facility]:   Refer to the procedure report that was given to you for any specific questions about what was found during the examination.  If the procedure report does not answer your questions, please call your gastroenterologist to clarify.  If you requested that your care partner not be given the details of your procedure findings, then the procedure report has been included in a sealed envelope for you to review at your convenience later.  YOU SHOULD EXPECT: Some feelings of bloating in the abdomen. Passage of more gas than usual.  Walking can help get rid of the air that was put into your GI tract during the procedure and reduce the bloating. If you had a lower endoscopy (such as a colonoscopy or flexible sigmoidoscopy) you may notice spotting of blood in your stool or on the toilet paper. If you underwent a bowel prep for your procedure, you may not have a normal bowel movement for a few days.  Please Note:  You might notice some irritation and congestion in your nose or some drainage.  This is from the oxygen used during your procedure.  There is no need for concern and it should clear up in a day or so.  SYMPTOMS TO REPORT IMMEDIATELY:   Following lower endoscopy (colonoscopy or flexible sigmoidoscopy):  Excessive amounts of blood in the stool  Significant tenderness or worsening of abdominal pains  Swelling of the abdomen that is new, acute  Fever of 100F or higher  For urgent or emergent issues, a gastroenterologist can be reached at any hour by calling [phone].   DIET:  We do recommend a small meal at first, but then you may proceed to your regular diet.  Drink plenty of fluids but you should avoid alcoholic beverages for 24 hours.  ACTIVITY:  You should plan to take it easy for the rest of today and you should NOT DRIVE or use heavy machinery until tomorrow  (because of the sedation medicines used during the test).    FOLLOW UP: Our staff will call the number listed on your records the next business day following your procedure to check on you and address any questions or concerns that you may have regarding the information given to you following your procedure. If we do not reach you, we will leave a message.  However, if you are feeling well and you are not experiencing any problems, there is no need to return our call.  We will assume that you have returned to your regular daily activities without incident.  If any biopsies were taken you will be contacted by phone or by letter within the next 1-3 weeks.  Please call us at [phone] if you have not heard about the biopsies in 3 weeks.    SIGNATURES/CONFIDENTIALITY: You and/or your care partner have signed paperwork which will be entered into your electronic medical record.  These signatures attest to the fact that that the information above on your After Visit Summary has been reviewed and is understood.  Full responsibility of the confidentiality of this discharge information lies with you and/or your care-partner. 

## 2017-12-16 NOTE — Progress Notes (Signed)
Report given to PACU, vss 

## 2017-12-16 NOTE — Progress Notes (Signed)
Pt's states no medical or surgical changes since previsit or office visit. 

## 2017-12-16 NOTE — Op Note (Signed)
[facility] Patient Name: Sue Conway Procedure Date: 12/16/2017 1:29 PM MRN: [id number] Endoscopist: Mauri Pole , MD Age: 72 Referring MD:  Date of Birth: Dec 01, 1945 Gender: Female Account #: [id number] Procedure:                Colonoscopy Indications:              Screening for colorectal malignant neoplasm, Last                            colonoscopy: 2009 Medicines:                Monitored Anesthesia Care Procedure:                Pre-Anesthesia Assessment:                           - Prior to the procedure, a History and Physical                            was performed, and patient medications and                            allergies were reviewed. The patient's tolerance of                            previous anesthesia was also reviewed. The risks                            and benefits of the procedure and the sedation                            options and risks were discussed with the patient.                            All questions were answered, and informed consent                            was obtained. Prior Anticoagulants: The patient has                            taken no previous anticoagulant or antiplatelet                            agents. ASA Grade Assessment: II - A patient with                            mild systemic disease. After reviewing the risks                            and benefits, the patient was deemed in                            satisfactory condition to undergo the procedure.  After obtaining informed consent, the colonoscope                            was passed under direct vision. Throughout the                            procedure, the patient's blood pressure, pulse, and                            oxygen saturations were monitored continuously. The                            Colonoscope was introduced through the anus and                            advanced to the the cecum, identified by                             appendiceal orifice and ileocecal valve. The                            colonoscopy was performed without difficulty. The                            patient tolerated the procedure well. The quality                            of the bowel preparation was excellent. The                            ileocecal valve, appendiceal orifice, and rectum                            were photographed. Scope In: 1:40:12 PM Scope Out: 1:55:39 PM Scope Withdrawal Time: 0 hours 8 minutes 9 seconds  Total Procedure Duration: 0 hours 15 minutes 27 seconds  Findings:                 The perianal and digital rectal examinations were                            normal.                           A 7 mm polyp was found in the ascending colon. The                            polyp was sessile. The polyp was removed with a                            cold snare. Resection and retrieval were complete.                           A few small-mouthed diverticula were found in the  sigmoid colon.                           Non-bleeding internal hemorrhoids were found during                            retroflexion. The hemorrhoids were large. Complications:            No immediate complications. Estimated Blood Loss:     Estimated blood loss was minimal. Impression:               - One 7 mm polyp in the ascending colon, removed                            with a cold snare. Resected and retrieved.                           - Diverticulosis in the sigmoid colon.                           - Non-bleeding internal hemorrhoids. Recommendation:           - Patient has a contact number available for                            emergencies. The signs and symptoms of potential                            delayed complications were discussed with the                            patient. Return to normal activities tomorrow.                            Written discharge instructions  were provided to the                            patient.                           - Resume previous diet.                           - Continue present medications.                           - Await pathology results.                           - Repeat colonoscopy in 5-10 years for surveillance                            based on pathology results. Mauri Pole, MD 12/16/2017 2:02:30 PM This report has been signed electronically.

## 2017-12-16 NOTE — Progress Notes (Signed)
Called to room to assist during endoscopic procedure.  Patient ID and intended procedure confirmed with present staff. Received instructions for my participation in the procedure from the performing physician.  

## 2017-12-17 ENCOUNTER — Telehealth: Payer: Self-pay

## 2017-12-17 NOTE — Telephone Encounter (Signed)
  Follow up Call-  Call Sue Conway number 12/16/2017  Post procedure Call Sue Conway phone  # [phone]  Permission to leave phone message Yes  Some recent data might be hidden     Patient questions:  Do you have a fever, pain , or abdominal swelling? No. Pain Score  0 *  Have you tolerated food without any problems? Yes.    Have you been able to return to your normal activities? Yes.    Do you have any questions about your discharge instructions: Diet   No. Medications  No. Follow up visit  No.  Do you have questions or concerns about your Care? No.  Actions: * If pain score is 4 or above: No action needed, pain <4.

## 2017-12-31 ENCOUNTER — Encounter: Payer: Self-pay | Admitting: Gastroenterology

## 2018-05-04 ENCOUNTER — Telehealth: Payer: Self-pay | Admitting: Family Medicine

## 2018-05-04 DIAGNOSIS — I10 Essential (primary) hypertension: Secondary | ICD-10-CM

## 2018-05-04 DIAGNOSIS — R739 Hyperglycemia, unspecified: Secondary | ICD-10-CM

## 2018-05-04 DIAGNOSIS — E7800 Pure hypercholesterolemia, unspecified: Secondary | ICD-10-CM

## 2018-05-04 NOTE — Telephone Encounter (Signed)
-----   Message from Eustace Pen, LPN sent at [id number]/02/6268  3:35 PM EST ----- Regarding: Labs 12/18 Lab orders needed. Thank you.  Insurance:  [employer]

## 2018-05-05 ENCOUNTER — Ambulatory Visit (INDEPENDENT_AMBULATORY_CARE_PROVIDER_SITE_OTHER): Payer: Medicare HMO

## 2018-05-05 VITALS — BP 138/70 | HR 80 | Temp 97.7°F | Ht 64.0 in | Wt 170.5 lb

## 2018-05-05 DIAGNOSIS — Z0000 Encounter for general adult medical examination without abnormal findings: Secondary | ICD-10-CM

## 2018-05-05 DIAGNOSIS — R739 Hyperglycemia, unspecified: Secondary | ICD-10-CM

## 2018-05-05 DIAGNOSIS — E7800 Pure hypercholesterolemia, unspecified: Secondary | ICD-10-CM

## 2018-05-05 DIAGNOSIS — I10 Essential (primary) hypertension: Secondary | ICD-10-CM | POA: Diagnosis not present

## 2018-05-05 LAB — CBC WITH DIFFERENTIAL/PLATELET
Basophils Absolute: 0 10*3/uL (ref 0.0–0.1)
Basophils Relative: 1 % (ref 0.0–3.0)
Eosinophils Absolute: 0.2 10*3/uL (ref 0.0–0.7)
Eosinophils Relative: 3.9 % (ref 0.0–5.0)
HEMATOCRIT: 37.3 % (ref 36.0–46.0)
Hemoglobin: 12.1 g/dL (ref 12.0–15.0)
Lymphocytes Relative: 31.5 % (ref 12.0–46.0)
Lymphs Abs: 1.6 10*3/uL (ref 0.7–4.0)
MCHC: 32.3 g/dL (ref 30.0–36.0)
MCV: 80.6 fl (ref 78.0–100.0)
MONOS PCT: 9.1 % (ref 3.0–12.0)
Monocytes Absolute: 0.5 10*3/uL (ref 0.1–1.0)
Neutro Abs: 2.8 10*3/uL (ref 1.4–7.7)
Neutrophils Relative %: 54.5 % (ref 43.0–77.0)
Platelets: 263 10*3/uL (ref 150.0–400.0)
RBC: 4.63 Mil/uL (ref 3.87–5.11)
RDW: 15.2 % (ref 11.5–15.5)
WBC: 5.1 10*3/uL (ref 4.0–10.5)

## 2018-05-05 LAB — COMPREHENSIVE METABOLIC PANEL
ALT: 13 U/L (ref 0–35)
AST: 19 U/L (ref 0–37)
Albumin: 4.3 g/dL (ref 3.5–5.2)
Alkaline Phosphatase: 85 U/L (ref 39–117)
BILIRUBIN TOTAL: 0.3 mg/dL (ref 0.2–1.2)
BUN: 12 mg/dL (ref 6–23)
CO2: 31 mEq/L (ref 19–32)
Calcium: 9.5 mg/dL (ref 8.4–10.5)
Chloride: 104 mEq/L (ref 96–112)
Creatinine, Ser: 0.82 mg/dL (ref 0.40–1.20)
GFR: 87.53 mL/min (ref >60.00)
Glucose, Bld: 92 mg/dL (ref 70–99)
Potassium: 3.7 mEq/L (ref 3.5–5.1)
Sodium: 141 mEq/L (ref 135–145)
Total Protein: 7.5 g/dL (ref 6.0–8.3)

## 2018-05-05 LAB — HEMOGLOBIN A1C: Hgb A1c MFr Bld: 6 % (ref 4.6–6.5)

## 2018-05-05 LAB — LIPID PANEL
Cholesterol: 189 mg/dL (ref 0–200)
HDL: 53.1 mg/dL (ref >39.00)
LDL Cholesterol: 105 mg/dL — ABNORMAL HIGH (ref 0–99)
NonHDL: 136.18
Total CHOL/HDL Ratio: 4
Triglycerides: 154 mg/dL — ABNORMAL HIGH (ref 0.0–149.0)
VLDL: 30.8 mg/dL (ref 0.0–40.0)

## 2018-05-05 LAB — TSH: TSH: 1.26 u[IU]/mL (ref 0.35–4.50)

## 2018-05-05 NOTE — Patient Instructions (Signed)
Sue Conway , Thank you for taking time to come for your Medicare Wellness Visit. I appreciate your ongoing commitment to your health goals. Please review the following plan we discussed and let me know if I can assist you in the future.   These are the goals we discussed: Goals    . Patient Stated     Starting 05/05/2018, I will continue to take medications as prescribed.        This is a list of the screening recommended for you and due dates:  Health Maintenance  Topic Date Due  . Tetanus Vaccine  05/19/2019*  . Stool Blood Test  12/17/2018  . Mammogram  04/03/2019  . Colon Cancer Screening  12/17/2027  . Flu Shot  Completed  . DEXA scan (bone density measurement)  Completed  .  Hepatitis C: One time screening is recommended by Center for Disease Control  (CDC) for  adults born from [birth year] through 1967.   Completed  . Pneumonia vaccines  Completed  *Topic was postponed. The date shown is not the original due date.   Preventive Care for Adults  A healthy lifestyle and preventive care can promote health and wellness. Preventive health guidelines for adults include the following key practices.  . A routine yearly physical is a good way to check with your health care provider about your health and preventive screening. It is a chance to share any concerns and updates on your health and to receive a thorough exam.  . Visit your dentist for a routine exam and preventive care every 6 months. Brush your teeth twice a day and floss once a day. Good oral hygiene prevents tooth decay and gum disease.  . The frequency of eye exams is based on your age, health, family medical history, use  of contact lenses, and other factors. Follow your health care provider's recommendations for frequency of eye exams.  . Eat a healthy diet. Foods like vegetables, fruits, whole grains, low-fat dairy products, and lean protein foods contain the nutrients you need without too many calories. Decrease your intake  of foods high in solid fats, added sugars, and salt. Eat the right amount of calories for you. Get information about a proper diet from your health care provider, if necessary.  . Regular physical exercise is one of the most important things you can do for your health. Most adults should get at least 150 minutes of moderate-intensity exercise (any activity that increases your heart rate and causes you to sweat) each week. In addition, most adults need muscle-strengthening exercises on 2 or more days a week.  Silver Sneakers may be a benefit available to you. To determine eligibility, you may visit the website: www.silversneakers.com or contact program at (208)481-8757 Mon-Fri between 8AM-8PM.   . Maintain a healthy weight. The body mass index (BMI) is a screening tool to identify possible weight problems. It provides an estimate of body fat based on height and weight. Your health care provider can find your BMI and can help you achieve or maintain a healthy weight.   For adults 20 years and older: ? A BMI below 18.5 is considered underweight. ? A BMI of 18.5 to 24.9 is normal. ? A BMI of 25 to 29.9 is considered overweight. ? A BMI of 30 and above is considered obese.   . Maintain normal blood lipids and cholesterol levels by exercising and minimizing your intake of saturated fat. Eat a balanced diet with plenty of fruit and vegetables. Blood tests  for lipids and cholesterol should begin at age 51 and be repeated every 5 years. If your lipid or cholesterol levels are high, you are over 50, or you are at high risk for heart disease, you may need your cholesterol levels checked more frequently. Ongoing high lipid and cholesterol levels should be treated with medicines if diet and exercise are not working.  . If you smoke, find out from your health care provider how to quit. If you do not use tobacco, please do not start.  . If you choose to drink alcohol, please do not consume more than 2 drinks  per day. One drink is considered to be 12 ounces (355 mL) of beer, 5 ounces (148 mL) of wine, or 1.5 ounces (44 mL) of liquor.  . If you are 64-34 years old, ask your health care provider if you should take aspirin to prevent strokes.  . Use sunscreen. Apply sunscreen liberally and repeatedly throughout the day. You should seek shade when your shadow is shorter than you. Protect yourself by wearing long sleeves, pants, a wide-brimmed hat, and sunglasses year round, whenever you are outdoors.  . Once a month, do a whole body skin exam, using a mirror to look at the skin on your back. Tell your health care provider of new moles, moles that have irregular borders, moles that are larger than a pencil eraser, or moles that have changed in shape or color.

## 2018-05-05 NOTE — Progress Notes (Signed)
PCP notes:   Health maintenance:  Tetanus vaccine - postponed/insurance  Abnormal screenings:   Hearing - failed  Hearing Screening   125Hz  250Hz  500Hz  1000Hz  2000Hz  3000Hz  4000Hz  6000Hz  8000Hz   Right ear:   0 40 40  40    Left ear:   0 40 40  0     Patient concerns:   None  Nurse concerns:  None  Next PCP appt:   I reviewed health advisor's note, was available for consultation, and agree with documentation and plan. Loura Pardon MD   05/07/18 @ 1400

## 2018-05-05 NOTE — Progress Notes (Signed)
Subjective:   Sue Conway is a 73 y.o. female who presents for Medicare Annual (Subsequent) preventive examination.  Review of Systems:  N/A Cardiac Risk Factors include: advanced age (>86men, >46 women);dyslipidemia     Objective:     Vitals: BP 138/70 (BP Location: Right Arm, Patient Position: Sitting, Cuff Size: Normal)   Pulse 80   Temp 97.7 F (36.5 C) (Oral)   Ht 5\' 4"  (1.626 m) Comment: shoes  Wt 170 lb 8 oz (77.3 kg)   LMP 05/19/1993   SpO2 97%   BMI 29.27 kg/m   Body mass index is 29.27 kg/m.  Advanced Directives 05/05/2018 11/08/2015 04/11/2015  Does Patient Have a Medical Advance Directive? No No No  Would patient like information on creating a medical advance directive? No - Patient declined No - patient declined information No - patient declined information    Tobacco Social History   Tobacco Use  Smoking Status Never Smoker  Smokeless Tobacco Never Used     Counseling given: No   Clinical Intake:  Pre-visit preparation completed: Yes  Pain : No/denies pain Pain Score: 0-No pain     Nutritional Status: BMI 25 -29 Overweight Nutritional Risks: None Diabetes: No  How often do you need to have someone help you when you read instructions, pamphlets, or other written materials from your doctor or pharmacy?: 1 - Never What is the last grade level you completed in school?: 12th grade  Interpreter Needed?: No  Comments: pt lives with spouse Information entered by :: LPinson, LPN  Past Medical History:  Diagnosis Date  . Allergy   . Anemia    slight- no tx as of yet   . Cataract    removed bilat with lens to left eye   . Chest pain   . Diverticulosis of colon    BE mild diverticulosis 01/1995  . Elevated blood sugar level   . Glaucoma 09/24/2005   history of glaucoma (Dr. Darolyn Rua)  . Hyperlipidemia   . Hypertension    eye drops elevated BP- not discontined since per pt.  . Osteopenia   . Ringing in ears    worse with aspirin  .  Tinnitus    Past Surgical History:  Procedure Laterality Date  . ABDOMINAL HYSTERECTOMY  1999   total hysterectomy fibroids  . BREAST BIOPSY  2013   needle  . CATARACT EXTRACTION, BILATERAL    . CEREBRAL ANEURYSM REPAIR  02/2003  . COLONOSCOPY    . FINE NEEDLE ASPIRATION  06/2010   breast   Family History  Problem Relation Age of Onset  . Diabetes Mother   . Hypertension Mother   . Heart disease Mother        CHF  . Diabetes Father   . Diabetes Brother   . Heart disease Brother 66       CVA and MI  . Breast cancer Neg Hx   . Colon cancer Neg Hx   . Colon polyps Neg Hx   . Esophageal cancer Neg Hx   . Rectal cancer Neg Hx   . Stomach cancer Neg Hx    Social History   Socioeconomic History  . Marital status: Married    Spouse name: Not on file  . Number of children: Not on file  . Years of education: Not on file  . Highest education level: Not on file  Occupational History  . Not on file  Social Needs  . Financial resource strain: Not on file  .  Food insecurity:    Worry: Not on file    Inability: Not on file  . Transportation needs:    Medical: Not on file    Non-medical: Not on file  Tobacco Use  . Smoking status: Never Smoker  . Smokeless tobacco: Never Used  Substance and Sexual Activity  . Alcohol use: No    Alcohol/week: 0.0 standard drinks  . Drug use: No  . Sexual activity: Never  Lifestyle  . Physical activity:    Days per week: Not on file    Minutes per session: Not on file  . Stress: Not on file  Relationships  . Social connections:    Talks on phone: Not on file    Gets together: Not on file    Attends religious service: Not on file    Active member of club or organization: Not on file    Attends meetings of clubs or organizations: Not on file    Relationship status: Not on file  Other Topics Concern  . Not on file  Social History Narrative  . Not on file    Outpatient Encounter Medications as of 05/05/2018  Medication Sig  .  amLODipine (NORVASC) 5 MG tablet Take 1 tablet (5 mg total) by mouth daily.  . Cetirizine HCl (ZYRTEC PO) Take 1 tablet by mouth as needed. Reported on 11/08/2015  . fluticasone (FLONASE) 50 MCG/ACT nasal spray Place 2 sprays into both nostrils daily. Reported on 11/08/2015  . Multiple Vitamin (MULTIVITAMIN) tablet Take 1 tablet by mouth daily. Centrum silver daily  . [DISCONTINUED] diphenhydrAMINE (SOMINEX) 25 MG tablet Take 25 mg by mouth at bedtime as needed for sleep.  . [DISCONTINUED] DUREZOL 0.05 % EMUL   . [DISCONTINUED] triamcinolone cream (KENALOG) 0.5 % Apply 1 application topically 2 (two) times daily. To affected areas (blisters)  . triamcinolone cream (KENALOG) 0.1 % Apply 1 application topically 2 (two) times daily. To affected/rash area (Patient not taking: Reported on 05/05/2018)   No facility-administered encounter medications on file as of 05/05/2018.     Activities of Daily Living In your present state of health, do you have any difficulty performing the following activities: 05/05/2018  Hearing? Y  Comment "roaring sound" in bilateral ears  Vision? N  Difficulty concentrating or making decisions? N  Walking or climbing stairs? N  Dressing or bathing? N  Doing errands, shopping? N  Preparing Food and eating ? N  Using the Toilet? N  In the past six months, have you accidently leaked urine? N  Do you have problems with loss of bowel control? N  Managing your Medications? N  Managing your Finances? N  Housekeeping or managing your Housekeeping? N  Some recent data might be hidden    Patient Care Team: Tower, Wynelle Fanny, MD as PCP - General Thelma Comp, [place] as Referring Physician (Optometry)    Assessment:   This is a routine wellness examination for [place].   Hearing Screening   125Hz  250Hz  500Hz  1000Hz  2000Hz  3000Hz  4000Hz  6000Hz  8000Hz   Right ear:   0 40 40  40    Left ear:   0 40 40  0    Vision Screening Comments: Vision exam in 2019 with Dr. Maryruth Hancock B.     Exercise Activities and Dietary recommendations Current Exercise Habits: The patient does not participate in regular exercise at present, Exercise limited by: None identified  Goals    . Patient Stated     Starting 05/05/2018, I will continue to take medications  as prescribed.        Fall Risk Fall Risk  05/05/2018 05/04/2017 11/08/2015 06/06/2014  Falls in the past year? 0 No No No   Depression Screen PHQ 2/9 Scores 05/05/2018 05/04/2017 11/08/2015 06/06/2014  PHQ - 2 Score 0 0 0 2  PHQ- 9 Score 0 1 - 6     Cognitive Function MMSE - Mini Mental State Exam 05/05/2018 11/08/2015  Orientation to time 5 5  Orientation to Place 5 5  Registration 3 3  Attention/ Calculation 0 0  Recall 3 3  Language- name 2 objects 0 0  Language- repeat 1 1  Language- follow 3 step command 3 3  Language- read & follow direction 0 0  Write a sentence 0 0  Copy design 0 0  Total score 20 20     PLEASE NOTE: A Mini-Cog screen was completed. Maximum score is 20. A value of 0 denotes this part of [place] MMSE was not completed or the patient failed this part of the Mini-Cog screening.   Mini-Cog Screening Orientation to Time - Max 5 pts Orientation to Place - Max 5 pts Registration - Max 3 pts Recall - Max 3 pts Language Repeat - Max 1 pts Language Follow 3 Step Command - Max 3 pts     Immunization History  Administered Date(s) Administered  . Influenza, High Dose Seasonal PF 01/24/2016, 02/05/2017, 02/17/2018  . Influenza,inj,Quad PF,6+ Mos 03/03/2014, 04/18/2015  . Pneumococcal Conjugate-13 06/06/2014  . Pneumococcal Polysaccharide-23 09/27/2010  . Td 12/17/2001  . Zoster 01/24/2016    Screening Tests Health Maintenance  Topic Date Due  . TETANUS/TDAP  05/19/2019 (Originally 12/18/2011)  . COLON CANCER SCREENING ANNUAL FOBT  12/17/2018  . MAMMOGRAM  04/03/2019  . COLONOSCOPY  12/17/2027  . INFLUENZA VACCINE  Completed  . DEXA SCAN  Completed  . Hepatitis C Screening   Completed  . PNA vac Low Risk Adult  Completed     Plan:     I have personally reviewed, addressed, and noted the following in the patient's chart:  A. Medical and social history B. Use of alcohol, tobacco or illicit drugs  C. Current medications and supplements D. Functional ability and status E.  Nutritional status F.  Physical activity G. Advance directives H. List of other physicians I.  Hospitalizations, surgeries, and ER visits in previous 12 months J.  [place] to include hearing, vision, cognitive, depression L. Referrals and appointments - none  In addition, I have reviewed and discussed with patient certain preventive protocols, quality metrics, and best practice recommendations. A written personalized care plan for preventive services as well as general preventive health recommendations were provided to patient.  See attached scanned questionnaire for additional information.   Signed,   Lindell Noe, MHA, BS, LPN Health Coach

## 2018-05-07 ENCOUNTER — Encounter: Payer: Self-pay | Admitting: Family Medicine

## 2018-05-07 ENCOUNTER — Ambulatory Visit (INDEPENDENT_AMBULATORY_CARE_PROVIDER_SITE_OTHER): Payer: Medicare HMO | Admitting: Family Medicine

## 2018-05-07 VITALS — BP 118/66 | HR 91 | Temp 98.3°F | Ht 64.0 in | Wt 168.8 lb

## 2018-05-07 DIAGNOSIS — M8588 Other specified disorders of bone density and structure, other site: Secondary | ICD-10-CM | POA: Diagnosis not present

## 2018-05-07 DIAGNOSIS — Z1231 Encounter for screening mammogram for malignant neoplasm of breast: Secondary | ICD-10-CM

## 2018-05-07 DIAGNOSIS — Z1211 Encounter for screening for malignant neoplasm of colon: Secondary | ICD-10-CM

## 2018-05-07 DIAGNOSIS — E7800 Pure hypercholesterolemia, unspecified: Secondary | ICD-10-CM

## 2018-05-07 DIAGNOSIS — Z0000 Encounter for general adult medical examination without abnormal findings: Secondary | ICD-10-CM | POA: Diagnosis not present

## 2018-05-07 DIAGNOSIS — R7303 Prediabetes: Secondary | ICD-10-CM | POA: Diagnosis not present

## 2018-05-07 DIAGNOSIS — I10 Essential (primary) hypertension: Secondary | ICD-10-CM

## 2018-05-07 MED ORDER — AMLODIPINE BESYLATE 5 MG PO TABS: 5.0000 mg | ORAL_TABLET | Freq: Every day | ORAL | 11 refills | Status: DC

## 2018-05-07 MED ORDER — FLUTICASONE PROPIONATE 50 MCG/ACT NA SUSP: 2.0000 | Freq: Every day | NASAL | 11 refills | Status: DC
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
## 2018-05-07 NOTE — Patient Instructions (Addendum)
Think about starting extra exercise  30 more minutes 5 days per week (walking or whatever you like)  Have an indoor and outdoor option   Try to get 1200-1500 mg of calcium per day with at least 1000 iu of vitamin D - for bone health   For cholesterol  Avoid red meat/ fried foods/ egg yolks/ fatty breakfast meats/ butter, cheese and high fat dairy/ and shellfish    For diabetes prevention  Try to get most of your carbohydrates from produce (with the exception of white potatoes)  Eat less bread/pasta/rice/snack foods/cereals/sweets and other items from the middle of the grocery store (processed carbs)   Take care of yourself !

## 2018-05-09 NOTE — Assessment & Plan Note (Signed)
Colonoscopy 3/19- with 5 y recall due to adenoma

## 2018-05-09 NOTE — Assessment & Plan Note (Signed)
Lab Results  Component Value Date   HGBA1C 6.0 05/05/2018   disc imp of low glycemic diet and wt loss to prevent DM2

## 2018-05-09 NOTE — Assessment & Plan Note (Signed)
Rev dexa report 3/19 Not taking ca or D- enc her to start  Disc need for calcium/ vitamin D/ wt bearing exercise and bone density test every 2 y to monitor Disc safety/ fracture risk in detail

## 2018-05-09 NOTE — Assessment & Plan Note (Signed)
Scheduled annual screening mammogram Nl breast exam today  Encouraged monthly self exams   

## 2018-05-09 NOTE — Assessment & Plan Note (Signed)
Reviewed health habits including diet and exercise and skin cancer prevention Reviewed appropriate screening tests for age  Also reviewed health mt list, fam hx and immunization status , as well as social and family history   See HPI Labs reviewed  Disc imp of better diet for lipids and glucose  Also weight loss  Disc plan for exercise  Will start ca and D for osteopenia

## 2018-05-09 NOTE — Assessment & Plan Note (Signed)
Disc goals for lipids and reasons to control them Rev last labs with pt Rev low sat fat diet in detail Intolerant to statins Given handout re: diet

## 2018-05-09 NOTE — Assessment & Plan Note (Signed)
bp in fair control at this time  BP Readings from Last 1 Encounters:  05/07/18 118/66   No changes needed Most recent labs reviewed  Disc lifstyle change with low sodium diet and exercise

## 2018-05-14 ENCOUNTER — Ambulatory Visit
Admission: RE | Admit: 2018-05-14 | Discharge: 2018-05-14 | Disposition: A | Discharge status: Home / Self Care | Payer: Medicare HMO | Source: Ambulatory Visit | Attending: Family Medicine | Admitting: Family Medicine

## 2018-05-14 DIAGNOSIS — Z1231 Encounter for screening mammogram for malignant neoplasm of breast: Secondary | ICD-10-CM | POA: Diagnosis not present

## 2019-04-29 ENCOUNTER — Other Ambulatory Visit: Payer: Self-pay | Admitting: *Deleted

## 2019-04-29 MED ORDER — AMLODIPINE BESYLATE 5 MG PO TABS: 5.0000 mg | ORAL_TABLET | Freq: Every day | ORAL | 1 refills | Status: DC

## 2019-04-29 MED ORDER — FLUTICASONE PROPIONATE 50 MCG/ACT NA SUSP: 2.0000 | Freq: Every day | NASAL | 1 refills | Status: DC

## 2019-05-17 ENCOUNTER — Telehealth: Payer: Self-pay | Admitting: Family Medicine

## 2019-05-17 DIAGNOSIS — E7800 Pure hypercholesterolemia, unspecified: Secondary | ICD-10-CM

## 2019-05-17 DIAGNOSIS — R7303 Prediabetes: Secondary | ICD-10-CM

## 2019-05-17 DIAGNOSIS — I10 Essential (primary) hypertension: Secondary | ICD-10-CM

## 2019-05-17 NOTE — Telephone Encounter (Signed)
-----   Message from Ellamae Sia sent at 05/05/2019  2:40 PM EST ----- Regarding: Lab orders for Wednesday, 12.30.20 Patient is scheduled for CPX labs, please order future labs, Thanks , Karna Christmas

## 2019-05-18 ENCOUNTER — Other Ambulatory Visit (INDEPENDENT_AMBULATORY_CARE_PROVIDER_SITE_OTHER): Payer: Medicare HMO

## 2019-05-18 ENCOUNTER — Ambulatory Visit: Payer: Medicare HMO

## 2019-05-18 ENCOUNTER — Other Ambulatory Visit: Payer: Self-pay

## 2019-05-18 ENCOUNTER — Ambulatory Visit (INDEPENDENT_AMBULATORY_CARE_PROVIDER_SITE_OTHER): Payer: Medicare HMO

## 2019-05-18 VITALS — HR 80 | Wt 167.0 lb

## 2019-05-18 DIAGNOSIS — I10 Essential (primary) hypertension: Secondary | ICD-10-CM

## 2019-05-18 DIAGNOSIS — E7800 Pure hypercholesterolemia, unspecified: Secondary | ICD-10-CM | POA: Diagnosis not present

## 2019-05-18 DIAGNOSIS — Z0000 Encounter for general adult medical examination without abnormal findings: Secondary | ICD-10-CM

## 2019-05-18 DIAGNOSIS — R7303 Prediabetes: Secondary | ICD-10-CM

## 2019-05-18 LAB — COMPREHENSIVE METABOLIC PANEL
ALT: 14 U/L (ref 0–35)
AST: 18 U/L (ref 0–37)
Albumin: 4.1 g/dL (ref 3.5–5.2)
Alkaline Phosphatase: 76 U/L (ref 39–117)
BUN: 12 mg/dL (ref 6–23)
CO2: 27 mEq/L (ref 19–32)
Calcium: 9.2 mg/dL (ref 8.4–10.5)
Chloride: 107 mEq/L (ref 96–112)
Creatinine, Ser: 0.73 mg/dL (ref 0.40–1.20)
GFR: 93.92 mL/min (ref >60.00)
Glucose, Bld: 94 mg/dL (ref 70–99)
Potassium: 3.8 mEq/L (ref 3.5–5.1)
Sodium: 142 mEq/L (ref 135–145)
Total Bilirubin: 0.3 mg/dL (ref 0.2–1.2)
Total Protein: 7.1 g/dL (ref 6.0–8.3)

## 2019-05-18 LAB — CBC WITH DIFFERENTIAL/PLATELET
Basophils Absolute: 0.1 10*3/uL (ref 0.0–0.1)
Basophils Relative: 0.9 % (ref 0.0–3.0)
Eosinophils Absolute: 0.1 10*3/uL (ref 0.0–0.7)
Eosinophils Relative: 1.6 % (ref 0.0–5.0)
HCT: 36.2 % (ref 36.0–46.0)
Hemoglobin: 11.6 g/dL — ABNORMAL LOW (ref 12.0–15.0)
Lymphocytes Relative: 37.7 % (ref 12.0–46.0)
Lymphs Abs: 2.2 10*3/uL (ref 0.7–4.0)
MCHC: 32.2 g/dL (ref 30.0–36.0)
MCV: 80.9 fl (ref 78.0–100.0)
Monocytes Absolute: 0.4 10*3/uL (ref 0.1–1.0)
Monocytes Relative: 7 % (ref 3.0–12.0)
Neutro Abs: 3 10*3/uL (ref 1.4–7.7)
Neutrophils Relative %: 52.8 % (ref 43.0–77.0)
Platelets: 262 10*3/uL (ref 150.0–400.0)
RBC: 4.47 Mil/uL (ref 3.87–5.11)
RDW: 15.5 % (ref 11.5–15.5)
WBC: 5.7 10*3/uL (ref 4.0–10.5)

## 2019-05-18 LAB — HEMOGLOBIN A1C: Hgb A1c MFr Bld: 6 % (ref 4.6–6.5)

## 2019-05-18 LAB — LIPID PANEL
Cholesterol: 194 mg/dL (ref 0–200)
HDL: 55.9 mg/dL (ref >39.00)
LDL Cholesterol: 117 mg/dL — ABNORMAL HIGH (ref 0–99)
NonHDL: 138.32
Total CHOL/HDL Ratio: 3
Triglycerides: 107 mg/dL (ref 0.0–149.0)
VLDL: 21.4 mg/dL (ref 0.0–40.0)

## 2019-05-18 LAB — TSH: TSH: 1.96 u[IU]/mL (ref 0.35–4.50)

## 2019-05-18 NOTE — Progress Notes (Addendum)
Subjective:   Sue Conway is a 73 y.o. female who presents for Medicare Annual (Subsequent) preventive examination.  Review of Systems: N/A   This visit is being conducted through telemedicine via telephone at the nurse health advisor's home address due to the COVID-19 pandemic. This patient has given me verbal consent via doximity to conduct this visit, patient states they are participating from their home address. Patient and myself are on the telephone call. There is no referral for this visit. Some vital signs may be absent or patient reported.    Patient identification: identified by name, DOB, and current address   Cardiac Risk Factors include: advanced age (>66men, >70 women);hypertension;Other (see comment), Risk factor comments: hypercholesterolemia     Objective:     Vitals: Pulse 80   Wt 167 lb (75.8 kg)   LMP 05/19/1993   SpO2 98%   BMI 28.67 kg/m   Body mass index is 28.67 kg/m.  Advanced Directives 05/18/2019 05/05/2018 11/08/2015 04/11/2015  Does Patient Have a Medical Advance Directive? No No No No  Would patient like information on creating a medical advance directive? No - Patient declined No - Patient declined No - patient declined information No - patient declined information    Tobacco Social History   Tobacco Use  Smoking Status Never Smoker  Smokeless Tobacco Never Used     Counseling given: Not Answered   Clinical Intake:  Pre-visit preparation completed: Yes  Pain : No/denies pain     Nutritional Risks: None Diabetes: No  How often do you need to have someone help you when you read instructions, pamphlets, or other written materials from your doctor or pharmacy?: 1 - Never What is the last grade level you completed in school?: 12th  Interpreter Needed?: No  Information entered by :: CJohnson, LPN  Past Medical History:  Diagnosis Date  . Allergy   . Anemia    slight- no tx as of yet   . Cataract    removed bilat with lens to  left eye   . Chest pain   . Diverticulosis of colon    BE mild diverticulosis 01/1995  . Elevated blood sugar level   . Glaucoma 09/24/2005   history of glaucoma (Dr. Darolyn Conway)  . Hyperlipidemia   . Hypertension    eye drops elevated BP- not discontined since per pt.  . Osteopenia   . Ringing in ears    worse with aspirin  . Tinnitus    Past Surgical History:  Procedure Laterality Date  . ABDOMINAL HYSTERECTOMY  1999   total hysterectomy fibroids  . BREAST BIOPSY N/A 2013   needle,benign  . CATARACT EXTRACTION, BILATERAL    . CEREBRAL ANEURYSM REPAIR  02/2003  . COLONOSCOPY    . FINE NEEDLE ASPIRATION  06/2010   breast  . OOPHORECTOMY     Family History  Problem Relation Age of Onset  . Diabetes Mother   . Hypertension Mother   . Heart disease Mother        CHF  . Diabetes Father   . Diabetes Brother   . Heart disease Brother 2       CVA and MI  . Breast cancer Neg Hx   . Colon cancer Neg Hx   . Colon polyps Neg Hx   . Esophageal cancer Neg Hx   . Rectal cancer Neg Hx   . Stomach cancer Neg Hx    Social History   Socioeconomic History  . Marital status:  Married    Spouse name: Not on file  . Number of children: Not on file  . Years of education: Not on file  . Highest education level: Not on file  Occupational History  . Not on file  Tobacco Use  . Smoking status: Never Smoker  . Smokeless tobacco: Never Used  Substance and Sexual Activity  . Alcohol use: No    Alcohol/week: 0.0 standard drinks  . Drug use: No  . Sexual activity: Never  Other Topics Concern  . Not on file  Social History Narrative  . Not on file   Social Determinants of Health   Financial Resource Strain: Low Risk   . Difficulty of Paying Living Expenses: Not hard at all  Food Insecurity: No Food Insecurity  . Worried About Charity fundraiser in the Last Year: Never true  . Ran Out of Food in the Last Year: Never true  Transportation Needs: No Transportation Needs  .  Lack of Transportation (Medical): No  . Lack of Transportation (Non-Medical): No  Physical Activity: Inactive  . Days of Exercise per Week: 0 days  . Minutes of Exercise per Session: 0 min  Stress: Stress Concern Present  . Feeling of Stress : To some extent  Social Connections:   . Frequency of Communication with Friends and Family: Not on file  . Frequency of Social Gatherings with Friends and Family: Not on file  . Attends Religious Services: Not on file  . Active Member of Clubs or Organizations: Not on file  . Attends Archivist Meetings: Not on file  . Marital Status: Not on file    Outpatient Encounter Medications as of 05/18/2019  Medication Sig  . amLODipine (NORVASC) 5 MG tablet Take 1 tablet (5 mg total) by mouth daily.  . Cetirizine HCl (ZYRTEC PO) Take 1 tablet by mouth as needed. Reported on 11/08/2015  . fluticasone (FLONASE) 50 MCG/ACT nasal spray Place 2 sprays into both nostrils daily. Reported on 11/08/2015  . Multiple Vitamin (MULTIVITAMIN) tablet Take 1 tablet by mouth daily. Centrum silver daily  . triamcinolone cream (KENALOG) 0.1 % Apply 1 application topically 2 (two) times daily. To affected/rash area   No facility-administered encounter medications on file as of 05/18/2019.    Activities of Daily Living In your present state of health, do you have any difficulty performing the following activities: 05/18/2019  Hearing? N  Vision? N  Difficulty concentrating or making decisions? N  Walking or climbing stairs? N  Dressing or bathing? N  Doing errands, shopping? N  Preparing Food and eating ? N  Using the Toilet? N  In the past six months, have you accidently leaked urine? N  Do you have problems with loss of bowel control? N  Managing your Medications? N  Managing your Finances? N  Housekeeping or managing your Housekeeping? N  Some recent data might be hidden    Patient Care Team: Conway, Sue Fanny, MD as PCP - General Sue Conway,  [place] as Referring Physician (Optometry)    Assessment:   This is a routine wellness examination for [name].  Exercise Activities and Dietary recommendations Current Exercise Habits: The patient does not participate in regular exercise at present, Exercise limited by: None identified  Goals    . Patient Stated     Starting 05/05/2018, I will continue to take medications as prescribed.     . Patient Stated     05/18/2019, I will try to go to the gym and  increase my physical activity.        Fall Risk Fall Risk  05/18/2019 05/05/2018 05/04/2017 11/08/2015 06/06/2014  Falls in the past year? 0 0 No No No  Number falls in past yr: 0 - - - -  Injury with Fall? 0 - - - -  Risk for fall due to : Medication side effect - - - -  Follow up Falls evaluation completed;Falls prevention discussed - - - -   Is the patient's home free of loose throw rugs in walkways, pet beds, electrical cords, etc?   yes      Grab bars in the bathroom? no      Handrails on the stairs?   no      Adequate lighting?   yes  Timed Get Up and Go performed: N/A  Depression Screen PHQ 2/9 Scores 05/18/2019 05/05/2018 05/04/2017 11/08/2015  PHQ - 2 Score 0 0 0 0  PHQ- 9 Score 0 0 1 -     Cognitive Function MMSE - Mini Mental State Exam 05/18/2019 05/05/2018 11/08/2015  Orientation to time 5 5 5   Orientation to Place 5 5 5   Registration 3 3 3   Attention/ Calculation 5 0 0  Recall 3 3 3   Language- name 2 objects - 0 0  Language- repeat 1 1 1   Language- follow 3 step command - 3 3  Language- read & follow direction - 0 0  Write a sentence - 0 0  Copy design - 0 0  Total score - 20 20  Mini Cog  Mini-Cog screen was completed. Maximum score is 22. A value of 0 denotes this part of the MMSE was not completed or the patient failed this part of the Mini-Cog screening.       Immunization History  Administered Date(s) Administered  . Fluad Quad(high Dose 65+) 03/02/2019  . Influenza Inj Mdck Quad Pf 03/02/2019    . Influenza, High Dose Seasonal PF 01/24/2016, 02/05/2017, 02/17/2018  . Influenza,inj,Quad PF,6+ Mos 03/03/2014, 04/18/2015  . Pneumococcal Conjugate-13 06/06/2014  . Pneumococcal Polysaccharide-23 09/27/2010  . Td 12/17/2001  . Zoster 01/24/2016    Qualifies for Shingles Vaccine? Yes   Screening Tests Health Maintenance  Topic Date Due  . COLON CANCER SCREENING ANNUAL FOBT  12/17/2018  . MAMMOGRAM  05/15/2019  . TETANUS/TDAP  05/19/2019 (Originally 12/18/2011)  . COLONOSCOPY  12/17/2027  . INFLUENZA VACCINE  Completed  . DEXA SCAN  Completed  . Hepatitis C Screening  Completed  . PNA vac Low Risk Adult  Completed    Cancer Screenings: Lung: Low Dose CT Chest recommended if Age 68-80 years, 30 pack-year currently smoking OR have quit w/in 15 years. Patient does not qualify. Breast:  Up to date on Mammogram? No, Patient wants the office to schedule appointment   Up to date of Bone Density/Dexa? Yes, completed 07/21/2017 Colorectal: completed 12/16/2017  Additional Screenings:  Hepatitis C Screening: 11/08/2015     Plan:    Patient will try to go to the gym and increase her physical activity.    I have personally reviewed and noted the following in the patient's chart:   . Medical and social history . Use of alcohol, tobacco or illicit drugs  . Current medications and supplements . Functional ability and status . Nutritional status . Physical activity . Advanced directives . List of other physicians . Hospitalizations, surgeries, and ER visits in previous 12 months . Vitals . Screenings to include cognitive, depression, and falls . Referrals and appointments  In addition, I have reviewed and discussed with patient certain preventive protocols, quality metrics, and best practice recommendations. A written personalized care plan for preventive services as well as general preventive health recommendations were provided to patient.     Andrez Grime,  LPN  [id number]

## 2019-05-18 NOTE — Progress Notes (Signed)
PCP notes:  Health Maintenance: Needs mammogram scheduled   Abnormal Screenings: none   Patient concerns: none   Nurse concerns: none   Next PCP appt.: 05/23/2019 @ 8:30 am

## 2019-05-18 NOTE — Patient Instructions (Signed)
Sue Conway , Thank you for taking time to come for your Medicare Wellness Visit. I appreciate your ongoing commitment to your health goals. Please review the following plan we discussed and let me know if I can assist you in the future.   Screening recommendations/referrals: Colonoscopy: Up to date, completed 12/16/2017 Mammogram: Patient wants office to set up appointment Bone Density: Up to date, completed 07/21/2017 Recommended yearly ophthalmology/optometry visit for glaucoma screening and checkup Recommended yearly dental visit for hygiene and checkup  Vaccinations: Influenza vaccine: Up to date, completed 03/02/2019 Pneumococcal vaccine: Completed series Tdap vaccine: decline Shingles vaccine: discussed    Advanced directives: Advance directive discussed with you today. Even though you declined this today please call our office should you change your mind and we can give you the proper paperwork for you to fill out.  Conditions/risks identified: hypertension, hypercholesterolemia   Next appointment: 05/23/2019 @ 8:30 am    Preventive Care 23 Years and Older, Female Preventive care refers to lifestyle choices and visits with your health care provider that can promote health and wellness. What does preventive care include?  A yearly physical exam. This is also called an annual well check.  Dental exams once or twice a year.  Routine eye exams. Ask your health care provider how often you should have your eyes checked.  Personal lifestyle choices, including:  Daily care of your teeth and gums.  Regular physical activity.  Eating a healthy diet.  Avoiding tobacco and drug use.  Limiting alcohol use.  Practicing safe sex.  Taking low-dose aspirin every day.  Taking vitamin and mineral supplements as recommended by your health care provider. What happens during an annual well check? The services and screenings done by your health care provider during your annual well check  will depend on your age, overall health, lifestyle risk factors, and family history of disease. Counseling  Your health care provider may ask you questions about your:  Alcohol use.  Tobacco use.  Drug use.  Emotional well-being.  Home and relationship well-being.  Sexual activity.  Eating habits.  History of falls.  Memory and ability to understand (cognition).  Work and work Statistician.  Reproductive health. Screening  You may have the following tests or measurements:  Height, weight, and BMI.  Blood pressure.  Lipid and cholesterol levels. These may be checked every 5 years, or more frequently if you are over 31 years old.  Skin check.  Lung cancer screening. You may have this screening every year starting at age 29 if you have a 30-pack-year history of smoking and currently smoke or have quit within the past 15 years.  Fecal occult blood test (FOBT) of the stool. You may have this test every year starting at age 86.  Flexible sigmoidoscopy or colonoscopy. You may have a sigmoidoscopy every 5 years or a colonoscopy every 10 years starting at age 30.  Hepatitis C blood test.  Hepatitis B blood test.  Sexually transmitted disease (STD) testing.  Diabetes screening. This is done by checking your blood sugar (glucose) after you have not eaten for a while (fasting). You may have this done every 1-3 years.  Bone density scan. This is done to screen for osteoporosis. You may have this done starting at age 54.  Mammogram. This may be done every 1-2 years. Talk to your health care provider about how often you should have regular mammograms. Talk with your health care provider about your test results, treatment options, and if necessary, the need  for more tests. Vaccines  Your health care provider may recommend certain vaccines, such as:  Influenza vaccine. This is recommended every year.  Tetanus, diphtheria, and acellular pertussis (Tdap, Td) vaccine. You may  need a Td booster every 10 years.  Zoster vaccine. You may need this after age 74.  Pneumococcal 13-valent conjugate (PCV13) vaccine. One dose is recommended after age 75.  Pneumococcal polysaccharide (PPSV23) vaccine. One dose is recommended after age 35. Talk to your health care provider about which screenings and vaccines you need and how often you need them. This information is not intended to replace advice given to you by your health care provider. Make sure you discuss any questions you have with your health care provider. Document Released: 06/01/2015 Document Revised: 01/23/2016 Document Reviewed: 03/06/2015 Elsevier Interactive Patient Education  2017 [place] Prevention in the Home Falls can cause injuries. They can happen to people of all ages. There are many things you can do to make your home safe and to help prevent falls. What can I do on the outside of my home?  Regularly fix the edges of walkways and driveways and fix any cracks.  Remove anything that might make you trip as you walk through a door, such as a raised step or threshold.  Trim any bushes or trees on the path to your home.  Use bright outdoor lighting.  Clear any walking paths of anything that might make someone trip, such as rocks or tools.  Regularly check to see if handrails are loose or broken. Make sure that both sides of any steps have handrails.  Any raised decks and porches should have guardrails on the edges.  Have any leaves, snow, or ice cleared regularly.  Use sand or salt on walking paths during winter.  Clean up any spills in your garage right away. This includes oil or grease spills. What can I do in the bathroom?  Use night lights.  Install grab bars by the toilet and in the tub and shower. Do not use towel bars as grab bars.  Use non-skid mats or decals in the tub or shower.  If you need to sit down in the shower, use a plastic, non-slip stool.  Keep the floor  dry. Clean up any water that spills on the floor as soon as it happens.  Remove soap buildup in the tub or shower regularly.  Attach bath mats securely with double-sided non-slip rug tape.  Do not have throw rugs and other things on the floor that can make you trip. What can I do in the bedroom?  Use night lights.  Make sure that you have a light by your bed that is easy to reach.  Do not use any sheets or blankets that are too big for your bed. They should not hang down onto the floor.  Have a firm chair that has side arms. You can use this for support while you get dressed.  Do not have throw rugs and other things on the floor that can make you trip. What can I do in the kitchen?  Clean up any spills right away.  Avoid walking on wet floors.  Keep items that you use a lot in easy-to-reach places.  If you need to reach something above you, use a strong step stool that has a grab bar.  Keep electrical cords out of the way.  Do not use floor polish or wax that makes floors slippery. If you must use wax, use  non-skid floor wax.  Do not have throw rugs and other things on the floor that can make you trip. What can I do with my stairs?  Do not leave any items on the stairs.  Make sure that there are handrails on both sides of the stairs and use them. Fix handrails that are broken or loose. Make sure that handrails are as long as the stairways.  Check any carpeting to make sure that it is firmly attached to the stairs. Fix any carpet that is loose or worn.  Avoid having throw rugs at the top or bottom of the stairs. If you do have throw rugs, attach them to the floor with carpet tape.  Make sure that you have a light switch at the top of the stairs and the bottom of the stairs. If you do not have them, ask someone to add them for you. What else can I do to help prevent falls?  Wear shoes that:  Do not have high heels.  Have rubber bottoms.  Are comfortable and fit you  well.  Are closed at the toe. Do not wear sandals.  If you use a stepladder:  Make sure that it is fully opened. Do not climb a closed stepladder.  Make sure that both sides of the stepladder are locked into place.  Ask someone to hold it for you, if possible.  Clearly mark and make sure that you can see:  Any grab bars or handrails.  First and last steps.  Where the edge of each step is.  Use tools that help you move around (mobility aids) if they are needed. These include:  Canes.  Walkers.  Scooters.  Crutches.  Turn on the lights when you go into a dark area. Replace any light bulbs as soon as they burn out.  Set up your furniture so you have a clear path. Avoid moving your furniture around.  If any of your floors are uneven, fix them.  If there are any pets around you, be aware of where they are.  Review your medicines with your doctor. Some medicines can make you feel dizzy. This can increase your chance of falling. Ask your doctor what other things that you can do to help prevent falls. This information is not intended to replace advice given to you by your health care provider. Make sure you discuss any questions you have with your health care provider. Document Released: 03/01/2009 Document Revised: 10/11/2015 Document Reviewed: 06/09/2014 Elsevier Interactive Patient Education  2017 [employer].

## 2019-05-23 ENCOUNTER — Other Ambulatory Visit: Payer: Self-pay

## 2019-05-23 ENCOUNTER — Ambulatory Visit (INDEPENDENT_AMBULATORY_CARE_PROVIDER_SITE_OTHER): Payer: Medicare HMO | Admitting: Family Medicine

## 2019-05-23 ENCOUNTER — Other Ambulatory Visit: Payer: Self-pay | Admitting: Family Medicine

## 2019-05-23 ENCOUNTER — Encounter: Payer: Self-pay | Admitting: Family Medicine

## 2019-05-23 VITALS — BP 138/74 | HR 68 | Temp 96.6°F | Ht 63.25 in | Wt 163.4 lb

## 2019-05-23 DIAGNOSIS — R7303 Prediabetes: Secondary | ICD-10-CM | POA: Diagnosis not present

## 2019-05-23 DIAGNOSIS — Z0000 Encounter for general adult medical examination without abnormal findings: Secondary | ICD-10-CM | POA: Diagnosis not present

## 2019-05-23 DIAGNOSIS — E7800 Pure hypercholesterolemia, unspecified: Secondary | ICD-10-CM | POA: Diagnosis not present

## 2019-05-23 DIAGNOSIS — I10 Essential (primary) hypertension: Secondary | ICD-10-CM

## 2019-05-23 DIAGNOSIS — M8588 Other specified disorders of bone density and structure, other site: Secondary | ICD-10-CM | POA: Diagnosis not present

## 2019-05-23 DIAGNOSIS — Z1231 Encounter for screening mammogram for malignant neoplasm of breast: Secondary | ICD-10-CM

## 2019-05-23 NOTE — Assessment & Plan Note (Signed)
dexa 3/19 reviewed  Can get next one in march at the earliest No falls or fx  Enc her strongly to get back on ca and D  Exercise as well

## 2019-05-23 NOTE — Assessment & Plan Note (Signed)
Disc goals for lipids and reasons to control them Rev last labs with pt Rev low sat fat diet in detail LDL is up slightly to 117  Made plan for diet change  If this continues to rise would consider statin in the future

## 2019-05-23 NOTE — Assessment & Plan Note (Addendum)
Reviewed health habits including diet and exercise and skin cancer prevention Reviewed appropriate screening tests for age  Also reviewed health mt list, fam hx and immunization status , as well as social and family history   See HPI Labs reviewed  Given phone number to schedule her own mammogram  Enc to resume ca and D Enc to get in a regular exercise habit  dexa due at earliest in march  Reviewed amw
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
## 2019-05-23 NOTE — Assessment & Plan Note (Signed)
Lab Results  Component Value Date   HGBA1C 6.0 05/18/2019   No change from prior disc imp of low glycemic diet and wt loss to prevent DM2

## 2019-05-23 NOTE — Assessment & Plan Note (Signed)
bp in fair control at this time  BP Readings from Last 1 Encounters:  05/23/19 138/74   No changes needed- I do recommend she resume the amlodipine which worked well  If side effects-stop it and update (? If this caused eye twitch) Most recent labs reviewed  Disc lifstyle change with low sodium diet and exercise

## 2019-05-23 NOTE — Patient Instructions (Addendum)
Look into some exercise videos for home - walking / yoga/ chair yoga -- a lot is out there   Don't forget to schedule your mammogram   Try to get 1200-1500 mg of calcium per day with at least 1000 iu of vitamin D - for bone health   Start making some small diet changes one at a time    For cholesterol Avoid red meat/ fried foods/ egg yolks/ fatty breakfast meats/ butter, cheese and high fat dairy/ and shellfish    For diabetes prevention  Try to get most of your carbohydrates from produce (with the exception of white potatoes)  Eat less bread/pasta/rice/snack foods/cereals/sweets and other items from the middle of the grocery store (processed carbs)   try amlodipine again and if side effects stop it and call and let us know

## 2019-05-25 ENCOUNTER — Ambulatory Visit
Admission: RE | Admit: 2019-05-25 | Discharge: 2019-05-25 | Disposition: A | Discharge status: Home / Self Care | Payer: Medicare HMO | Source: Ambulatory Visit | Attending: Family Medicine | Admitting: Family Medicine

## 2019-05-25 DIAGNOSIS — Z1231 Encounter for screening mammogram for malignant neoplasm of breast: Secondary | ICD-10-CM | POA: Insufficient documentation

## 2019-07-14 ENCOUNTER — Ambulatory Visit: Payer: Medicare HMO | Attending: Internal Medicine

## 2019-07-14 DIAGNOSIS — Z23 Encounter for immunization: Secondary | ICD-10-CM | POA: Insufficient documentation

## 2019-07-14 NOTE — Progress Notes (Signed)
   Covid-19 Vaccination Clinic  Name:  Sue Conway    MRN: [id number] DOB: [birth year]  07/14/2019  [name] was observed post Covid-19 immunization for 15 minutes without incidence. She was provided with Vaccine Information Sheet and instruction to access the V-Safe system.   [name] was instructed to call 911 with any severe reactions post vaccine: Marland Kitchen Difficulty breathing  . Swelling of your face and throat  . A fast heartbeat  . A bad rash all over your body  . Dizziness and weakness    Immunizations Administered    Name Date Dose VIS Date Route   Pfizer COVID-19 Vaccine 07/14/2019 10:57 AM 0.3 mL 04/29/2019 Intramuscular   Manufacturer: [place]   Lot: [id number]   [place]: [id number]

## 2019-07-14 NOTE — Progress Notes (Signed)
   Covid-19 Vaccination Clinic  Name:  Sue Conway    MRN: [id number] DOB: Nov 25, 1945  07/14/2019  [name] was observed post Covid-19 immunization for 15 minutes without incidence. She was provided with Vaccine Information Sheet and instruction to access the V-Safe system.   [name] was instructed to call 911 with any severe reactions post vaccine: Marland Kitchen Difficulty breathing  . Swelling of your face and throat  . A fast heartbeat  . A bad rash all over your body  . Dizziness and weakness    Immunizations Administered    Name Date Dose VIS Date Route   Pfizer COVID-19 Vaccine 07/14/2019 10:57 AM 0.3 mL 04/29/2019 Intramuscular   Manufacturer: [place]   Lot: [id number]   [place]: [id number]

## 2019-08-09 ENCOUNTER — Ambulatory Visit: Payer: Medicare HMO | Attending: Internal Medicine

## 2019-08-09 DIAGNOSIS — Z23 Encounter for immunization: Secondary | ICD-10-CM

## 2019-08-09 NOTE — Progress Notes (Signed)
   Covid-19 Vaccination Clinic  Name:  Sue Conway    MRN: [id number] DOB: 1945-05-31  08/09/2019  Sue Conway was observed post Covid-19 immunization for 15 minutes without incident. She was provided with Vaccine Information Sheet and instruction to access the V-Safe system.   Sue Conway was instructed to call 911 with any severe reactions post vaccine: Marland Kitchen Difficulty breathing  . Swelling of face and throat  . A fast heartbeat  . A bad rash all over body  . Dizziness and weakness   Immunizations Administered    Name Date Dose VIS Date Route   Pfizer COVID-19 Vaccine 08/09/2019 11:05 AM 0.3 mL 04/29/2019 Intramuscular   Manufacturer: [place]   Lot: [id number]   [place]: [id number]

## 2019-08-24 DIAGNOSIS — H5213 Myopia, bilateral: Secondary | ICD-10-CM | POA: Diagnosis not present

## 2019-08-24 DIAGNOSIS — Z9841 Cataract extraction status, right eye: Secondary | ICD-10-CM | POA: Diagnosis not present

## 2019-08-24 DIAGNOSIS — Z9842 Cataract extraction status, left eye: Secondary | ICD-10-CM | POA: Diagnosis not present

## 2019-09-06 ENCOUNTER — Other Ambulatory Visit: Payer: Self-pay | Admitting: *Deleted

## 2019-09-06 MED ORDER — AMLODIPINE BESYLATE 5 MG PO TABS: 5.0000 mg | ORAL_TABLET | Freq: Every day | ORAL | 2 refills | Status: DC

## 2019-11-08 ENCOUNTER — Other Ambulatory Visit: Payer: Self-pay

## 2019-11-08 ENCOUNTER — Encounter (HOSPITAL_COMMUNITY): Payer: Self-pay | Admitting: Emergency Medicine

## 2019-11-08 ENCOUNTER — Emergency Department (HOSPITAL_COMMUNITY)
Admission: EM | Admit: 2019-11-08 | Discharge: 2019-11-09 | Disposition: A | Discharge status: Home / Self Care | Payer: Medicare HMO | Attending: Emergency Medicine | Admitting: Emergency Medicine

## 2019-11-08 DIAGNOSIS — Z5321 Procedure and treatment not carried out due to patient leaving prior to being seen by health care provider: Secondary | ICD-10-CM | POA: Insufficient documentation

## 2019-11-08 DIAGNOSIS — K921 Melena: Secondary | ICD-10-CM | POA: Insufficient documentation

## 2019-11-08 DIAGNOSIS — R1032 Left lower quadrant pain: Secondary | ICD-10-CM | POA: Insufficient documentation

## 2019-11-08 LAB — CBC WITH DIFFERENTIAL/PLATELET
Abs Immature Granulocytes: 0.03 10*3/uL (ref 0.00–0.07)
Basophils Absolute: 0 10*3/uL (ref 0.0–0.1)
Basophils Relative: 1 %
Eosinophils Absolute: 0.1 10*3/uL (ref 0.0–0.5)
Eosinophils Relative: 1 %
HCT: 38.7 % (ref 36.0–46.0)
Hemoglobin: 11.8 g/dL — ABNORMAL LOW (ref 12.0–15.0)
Immature Granulocytes: 1 %
Lymphocytes Relative: 32 %
Lymphs Abs: 1.9 10*3/uL (ref 0.7–4.0)
MCH: 25.9 pg — ABNORMAL LOW (ref 26.0–34.0)
MCHC: 30.5 g/dL (ref 30.0–36.0)
MCV: 84.9 fL (ref 80.0–100.0)
Monocytes Absolute: 0.4 10*3/uL (ref 0.1–1.0)
Monocytes Relative: 7 %
Neutro Abs: 3.5 10*3/uL (ref 1.7–7.7)
Neutrophils Relative %: 58 %
Platelets: 291 10*3/uL (ref 150–400)
RBC: 4.56 MIL/uL (ref 3.87–5.11)
RDW: 15.3 % (ref 11.5–15.5)
WBC: 5.9 10*3/uL (ref 4.0–10.5)
nRBC: 0 % (ref 0.0–0.2)

## 2019-11-08 LAB — SAMPLE TO BLOOD BANK

## 2019-11-08 LAB — URINALYSIS, ROUTINE W REFLEX MICROSCOPIC
Bacteria, UA: NONE SEEN
Bilirubin Urine: NEGATIVE
Glucose, UA: NEGATIVE mg/dL
Hgb urine dipstick: NEGATIVE
Ketones, ur: NEGATIVE mg/dL
Nitrite: NEGATIVE
Protein, ur: NEGATIVE mg/dL
Specific Gravity, Urine: 1.023 (ref 1.005–1.030)
pH: 6 (ref 5.0–8.0)

## 2019-11-08 LAB — COMPREHENSIVE METABOLIC PANEL
ALT: 16 U/L (ref 0–44)
AST: 22 U/L (ref 15–41)
Albumin: 3.9 g/dL (ref 3.5–5.0)
Alkaline Phosphatase: 88 U/L (ref 38–126)
Anion gap: 9 (ref 5–15)
BUN: 15 mg/dL (ref 8–23)
CO2: 24 mmol/L (ref 22–32)
Calcium: 9.1 mg/dL (ref 8.9–10.3)
Chloride: 106 mmol/L (ref 98–111)
Creatinine, Ser: 1.03 mg/dL — ABNORMAL HIGH (ref 0.44–1.00)
GFR calc Af Amer: >60 mL/min (ref >60)
GFR calc non Af Amer: 53 mL/min — ABNORMAL LOW (ref >60)
Glucose, Bld: 105 mg/dL — ABNORMAL HIGH (ref 70–99)
Potassium: 3.9 mmol/L (ref 3.5–5.1)
Sodium: 139 mmol/L (ref 135–145)
Total Bilirubin: 0.4 mg/dL (ref 0.3–1.2)
Total Protein: 7.1 g/dL (ref 6.5–8.1)

## 2019-11-08 LAB — LIPASE, BLOOD: Lipase: 34 U/L (ref 11–51)

## 2019-11-08 NOTE — ED Triage Notes (Signed)
Patient reports bloody stool today with LLQ pain , no emesis or diarrhea . Denies fever or chills .

## 2019-11-09 ENCOUNTER — Ambulatory Visit (INDEPENDENT_AMBULATORY_CARE_PROVIDER_SITE_OTHER): Payer: Medicare HMO | Admitting: Family Medicine

## 2019-11-09 ENCOUNTER — Encounter: Payer: Self-pay | Admitting: Family Medicine

## 2019-11-09 ENCOUNTER — Telehealth: Payer: Self-pay

## 2019-11-09 VITALS — BP 120/60 | HR 76 | Temp 97.4°F | Ht 63.25 in | Wt 162.0 lb

## 2019-11-09 DIAGNOSIS — K5733 Diverticulitis of large intestine without perforation or abscess with bleeding: Secondary | ICD-10-CM | POA: Diagnosis not present

## 2019-11-09 DIAGNOSIS — K648 Other hemorrhoids: Secondary | ICD-10-CM

## 2019-11-09 DIAGNOSIS — D508 Other iron deficiency anemias: Secondary | ICD-10-CM | POA: Diagnosis not present

## 2019-11-09 MED ORDER — AMOXICILLIN-POT CLAVULANATE 875-125 MG PO TABS
1.0000 | ORAL_TABLET | Freq: Two times a day (BID) | ORAL | 0 refills | Status: AC
Start: 2019-11-09 — End: 2019-11-19

## 2019-11-09 NOTE — Patient Instructions (Signed)
For stool softener: get some Docusate sodium and take twice a day while on the antibiotics  Take your antibiotic with food

## 2019-11-09 NOTE — Telephone Encounter (Signed)
[facility] - Client TELEPHONE ADVICE RECORD [facility] Patient Name: Sue Conway Gender: Female DOB: 1945/07/20 Age: 74 Y 11 M 17 D Return Phone Number: [phone] (Primary), [phone] (Secondary) [place] [address] Client [facility] - Client Client Site [facility], Roque Lias - MD Contact Type Call Who Is Calling Patient / Member / Family / Caregiver Call Type Triage / Clinical Relationship To Patient Self Return Phone Number [phone] (Primary) Chief Complaint Blood In Stool Reason for Call Symptomatic / Request for [place] stated that she has blood in her stool. Translation No Nurse Assessment Nurse: Fabio Neighbors, RN, Kitty Date/Time (Eastern Time): 11/08/2019 7:01:12 PM Confirm and document reason for call. If symptomatic, describe symptoms. ---Caller states that she is experiencing blood in her stool. She has been taking OTC iron medications. She is experiencing RLQ pain. When she went to the BR, she noted blood in the toilet. No clots. Has the patient had close contact with a person known or suspected to have the novel coronavirus illness OR traveled / lives in area with major community spread (including international travel) in the last 14 days from the onset of symptoms? * If Asymptomatic, screen for exposure and travel within the last 14 days. ---No Does the patient have any new or worsening symptoms? ---Yes Will a triage be completed? ---Yes Related visit to physician within the last 2 weeks? ---No Does the PT have any chronic conditions? (i.e. diabetes, asthma, this includes High risk factors for pregnancy, etc.) ---Yes List chronic conditions. ---anemia Is this a behavioral health or substance abuse call? ---No Guidelines Guideline Title Affirmed Question Affirmed Notes Nurse Date/Time  (Eastern Time) Rectal Bleeding Patient sounds very sick or weak to the triager [place], RN, [facility] 11/08/2019 7:06:36 PM Disp. Time Eilene Ghazi Time) Disposition Final User 11/08/2019 7:10:43 PM Go to ED Now (or PCP triage) Yes Fabio Neighbors, RN, Kitty PLEASE NOTE: All timestamps contained within this report are represented as [place] Standard Time. CONFIDENTIALTY NOTICE: This fax transmission is intended only for the addressee. It contains information that is legally privileged, confidential or otherwise protected from use or disclosure. If you are not the intended recipient, you are strictly prohibited from reviewing, disclosing, copying using or disseminating any of this information or taking any action in reliance on or regarding this information. If you have received this fax in error, please notify us immediately by telephone so that we can arrange for its return to [place]. Phone: [phone], Toll-Free: [phone], Fax: [phone] Page: 2 of 2 Call Id: [id number] [place] Disagree/Comply Comply Caller Understands Yes PreDisposition Call Doctor Care Advice Given Per Guideline GO TO ED NOW (OR PCP TRIAGE): ANOTHER ADULT SHOULD DRIVE: * It is better and safer if another adult drives instead of you. Comments User: Romie Jumper, RN Date/Time Eilene Ghazi Time): 11/08/2019 7:07:57 PM Hx of tinnitus, sinus issue Referrals [facility]

## 2019-11-09 NOTE — Progress Notes (Signed)
Sue Brenton T. Octavia Velador, MD, [place] at [facility] [name], [address]  Phone: [phone]  FAX: [place] - 74 y.o. female  MRN [id number]  Date of Birth: 08/02/1945  Date: 11/09/2019  PCP: Abner Greenspan, MD  Referral: Abner Greenspan, MD  Chief Complaint  Patient presents with  . Blood In Stools  . LLQ Pain    This visit occurred during the SARS-CoV-2 public health emergency.  Safety protocols were in place, including screening questions prior to the visit, additional usage of staff PPE, and extensive cleaning of exam room while observing appropriate contact time as indicated for disinfecting solutions.   Subjective:   Sue Conway is a 74 y.o. very pleasant female patient with Body mass index is 28.47 kg/m. who presents with the following:  She was in the ER yesterday.  She left AMA.  She is a very nice lady, and triage told her to go to the ER yesterday after she had some blood in her stool.  Ultimately she left the ER after there was a 10-hour wait.  At this point she feels fine and is not having any blood in her stool.  She is not lightheaded, she does not have a fever.  She is not having any cough or chest pain.  Primarily she is having some left lower quadrant pain, but this is not severe.  She is having some mildly decreased appetite.  She is mildly anemic, and noted some blood in her stool.  Had some blood in her stool.  OK, has not had any BM.   Did have some LLQ pain.    2019 Colonoscopy, tiny polyp Hemorrhoids.  No other significant findings.    Results for orders placed or performed during the hospital encounter of 11/08/19  CBC with Differential  Result Value Ref Range   WBC 5.9 4.0 - 10.5 K/uL   RBC 4.56 3.87 - 5.11 MIL/uL   Hemoglobin 11.8 (L) 12.0 - 15.0 g/dL   HCT 38.7 36 - 46 %   MCV 84.9 80.0 - 100.0 fL   MCH 25.9 (L) 26.0 - 34.0  pg   MCHC 30.5 30.0 - 36.0 g/dL   RDW 15.3 11.5 - 15.5 %   Platelets 291 150 - 400 K/uL   nRBC 0.0 0.0 - 0.2 %   Neutrophils Relative % 58 %   Neutro Abs 3.5 1.7 - 7.7 K/uL   Lymphocytes Relative 32 %   Lymphs Abs 1.9 0.7 - 4.0 K/uL   Monocytes Relative 7 %   Monocytes Absolute 0.4 0 - 1 K/uL   Eosinophils Relative 1 %   Eosinophils Absolute 0.1 0 - 0 K/uL   Basophils Relative 1 %   Basophils Absolute 0.0 0 - 0 K/uL   Immature Granulocytes 1 %   Abs Immature Granulocytes 0.03 0.00 - 0.07 K/uL  Comprehensive metabolic panel  Result Value Ref Range   Sodium 139 135 - 145 mmol/L   Potassium 3.9 3.5 - 5.1 mmol/L   Chloride 106 98 - 111 mmol/L   CO2 24 22 - 32 mmol/L   Glucose, Bld 105 (H) 70 - 99 mg/dL   BUN 15 8 - 23 mg/dL   Creatinine, Ser 1.03 (H) 0.44 - 1.00 mg/dL   Calcium 9.1 8.9 - 10.3 mg/dL   Total Protein 7.1 6.5 - 8.1 g/dL   Albumin 3.9 3.5 -  5.0 g/dL   AST 22 15 - 41 U/L   ALT 16 0 - 44 U/L   Alkaline Phosphatase 88 38 - 126 U/L   Total Bilirubin 0.4 0.3 - 1.2 mg/dL   GFR calc non Af Amer 53 (L) >60 mL/min   GFR calc Af Amer >60 >60 mL/min   Anion gap 9 5 - 15  Lipase, blood  Result Value Ref Range   Lipase 34 11 - 51 U/L  Urinalysis, Routine w reflex microscopic  Result Value Ref Range   Color, Urine YELLOW YELLOW   APPearance CLEAR CLEAR   Specific Gravity, Urine 1.023 1.005 - 1.030   pH 6.0 5.0 - 8.0   Glucose, UA NEGATIVE NEGATIVE mg/dL   Hgb urine dipstick NEGATIVE NEGATIVE   Bilirubin Urine NEGATIVE NEGATIVE   Ketones, ur NEGATIVE NEGATIVE mg/dL   Protein, ur NEGATIVE NEGATIVE mg/dL   Nitrite NEGATIVE NEGATIVE   Leukocytes,Ua TRACE (A) NEGATIVE   RBC / HPF 0-5 0 - 5 RBC/hpf   WBC, UA 0-5 0 - 5 WBC/hpf   Bacteria, UA NONE SEEN NONE SEEN   Squamous Epithelial / LPF 0-5 0 - 5   Mucus PRESENT   Sample to Blood Bank  Result Value Ref Range   Blood Bank Specimen SAMPLE AVAILABLE FOR TESTING    Sample Expiration      11/09/2019,2359 Performed at  [facility], [address] [place], [place] [address]      Review of Systems is noted in the HPI, as appropriate  Objective:   BP 120/60   Pulse 76   Temp (!) 97.4 F (36.3 C) (Temporal)   Ht 5' 3.25" (1.607 m)   Wt 162 lb (73.5 kg)   LMP 05/19/1993   SpO2 99%   BMI 28.47 kg/m   GEN: No acute distress; alert,appropriate. PULM: Breathing comfortably in no respiratory distress PSYCH: Normally interactive.  PULM: Normal respiratory rate, no accessory muscle use. No wheezes, crackles or rhonchi  ABD: S, mild left lower quadrant pain, ND, + BS, No rebound, No HSM   Laboratory and Imaging Data:  Assessment and Plan:     ICD-10-CM   1. Diverticulitis of colon with bleeding  K57.33   2. Internal hemorrhoid  K64.8   3. Other iron deficiency anemia  D50.8    Clinically, consistent with diverticulitis.  No further bleeding compared to yesterday.  Reviewed all anatomy with her and plans of care.  Treat with Augmentin x10 days.  Take with food.  Precautions, follow-up indicated if she still has some persistent bleeding.  Hemoglobin is just slightly low.  Begin iron sulfate 325 mg daily  Patient Instructions  For stool softener: get some Docusate sodium and take twice a day while on the antibiotics  Take your antibiotic with food    Follow-up: No follow-ups on file.  Meds ordered this encounter  Medications  . amoxicillin-clavulanate (AUGMENTIN) 875-125 MG tablet    Sig: Take 1 tablet by mouth 2 (two) times daily for 10 days.    Dispense:  20 tablet    Refill:  0   Medications Discontinued During This Encounter  Medication Reason  . triamcinolone cream (KENALOG) 0.1 % Completed Course   No orders of the defined types were placed in this encounter.   Signed,  Maud Deed. Kathryne Ramella, MD   Outpatient Encounter Medications as of 11/09/2019  Medication Sig  . amLODipine (NORVASC) 5 MG tablet Take 1 tablet (5 mg total) by mouth daily.  Marland Kitchen  Cetirizine HCl (ZYRTEC PO)  Take 1 tablet by mouth as needed. Reported on 11/08/2015  . fluticasone (FLONASE) 50 MCG/ACT nasal spray Place 2 sprays into both nostrils daily. Reported on 11/08/2015  . Multiple Vitamin (MULTIVITAMIN) tablet Take 1 tablet by mouth daily. Centrum silver daily  . amoxicillin-clavulanate (AUGMENTIN) 875-125 MG tablet Take 1 tablet by mouth 2 (two) times daily for 10 days.  . [DISCONTINUED] triamcinolone cream (KENALOG) 0.1 % Apply 1 application topically 2 (two) times daily. To affected/rash area   No facility-administered encounter medications on file as of 11/09/2019.

## 2019-11-09 NOTE — ED Notes (Signed)
Pt stated they were leaving  

## 2019-11-09 NOTE — Telephone Encounter (Signed)
Pt went to [facility] ED and [facility]. I spoke with pt; I woke pt up and she has not seen any blood since left ED due to wait being too long. Pt said she is not going back to ED and request appt today at [facility]. Pt continues with RLQ pain at pain level of 5.No fever, no N&V or diarrhea.Pt scheduled in office appt with Dr Lorelei Pont today at 10:20. Pt has no covid symptoms, no travel and no known exposure to + covid. UC & ED precautions given and pt voiced understanding. Sending to DR Glori Bickers who is PCP but out of office as FYI and to Dr Lorelei Pont who is in office.

## 2020-01-11 IMAGING — MG DIGITAL SCREENING BILATERAL MAMMOGRAM WITH TOMO AND CAD
6 of 10 series · 6 of 30 positions shown · non-contrast
Comparison: Previous exam(s).

CLINICAL DATA: Screening.

EXAM:
DIGITAL SCREENING BILATERAL MAMMOGRAM WITH TOMO AND CAD

[R MLO synth-2D]
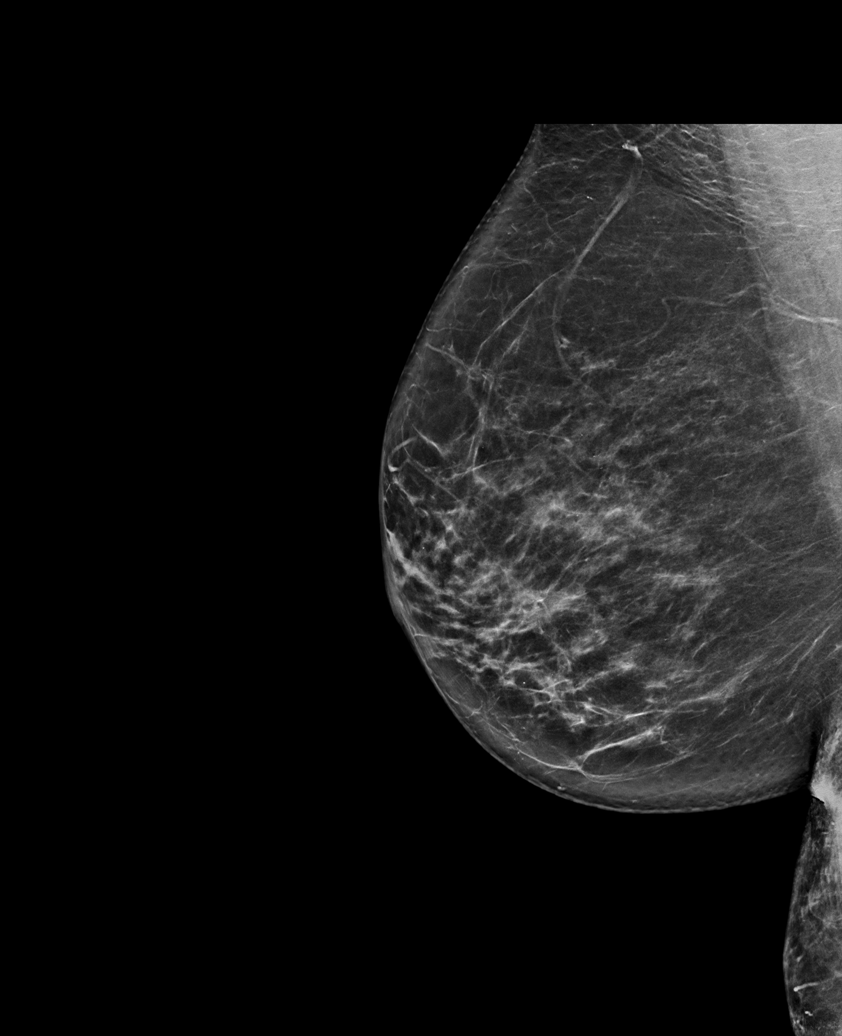

[L MLO synth-2D (1 of 2)]
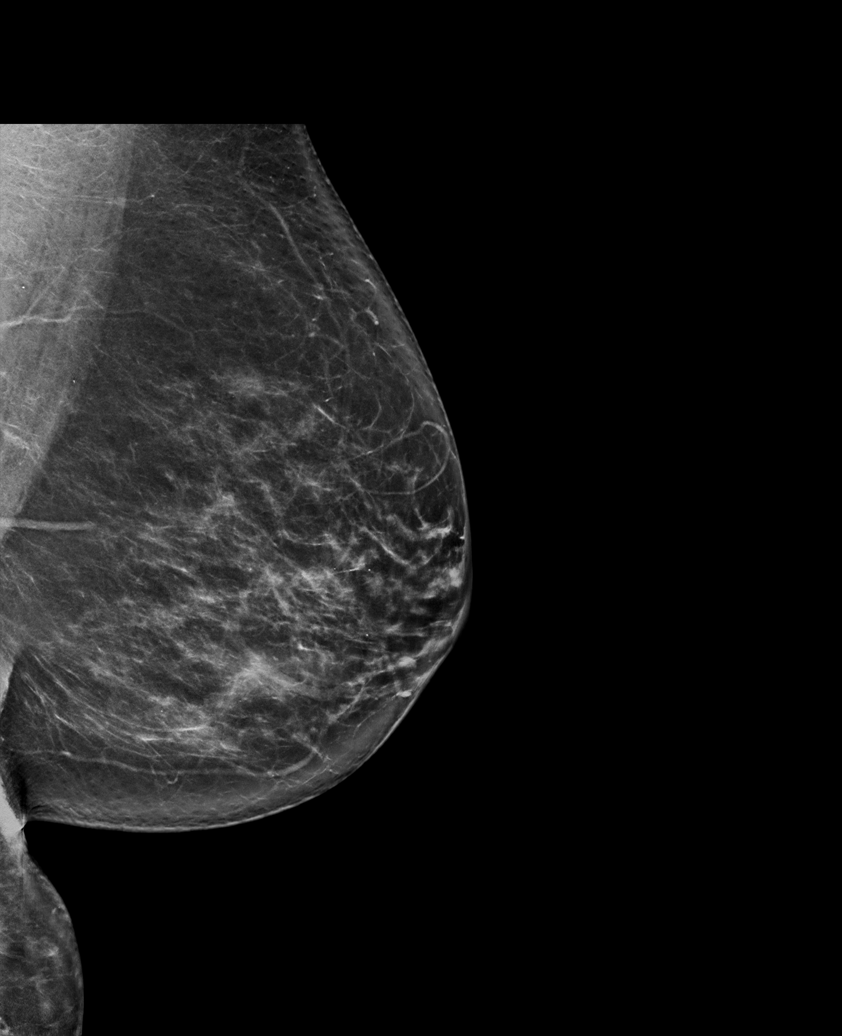

[R CC synth-2D]
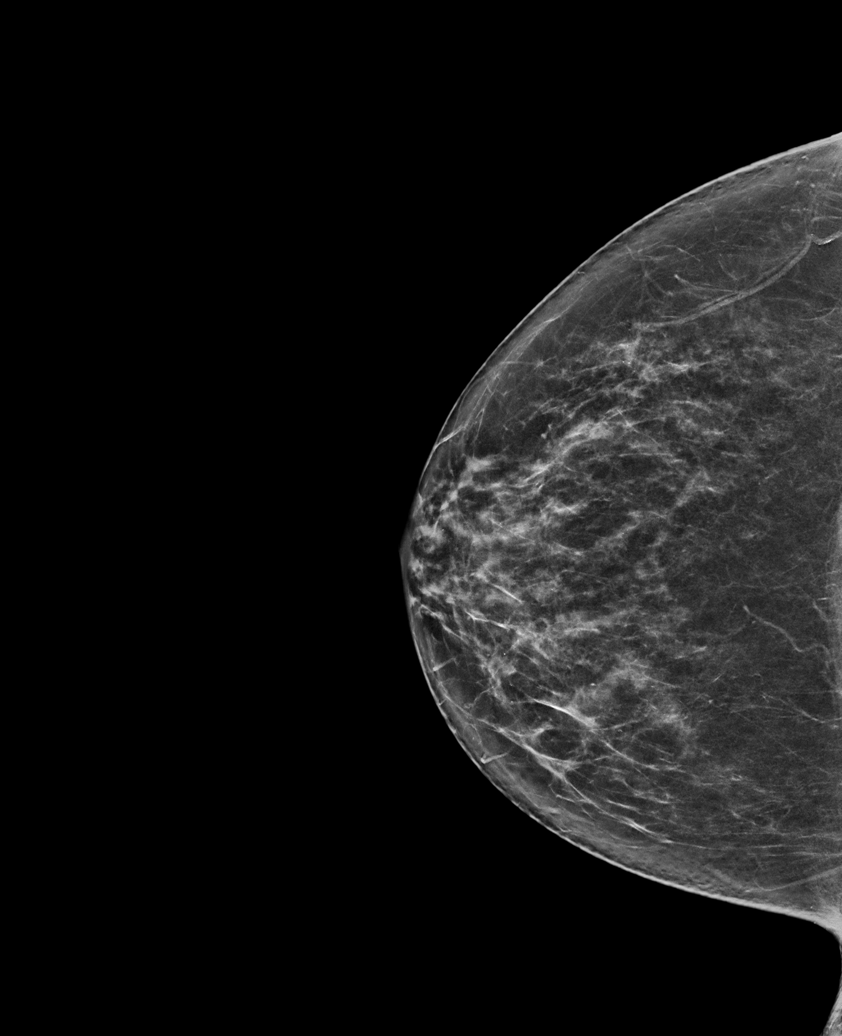

[L MLO synth-2D (2 of 2)]
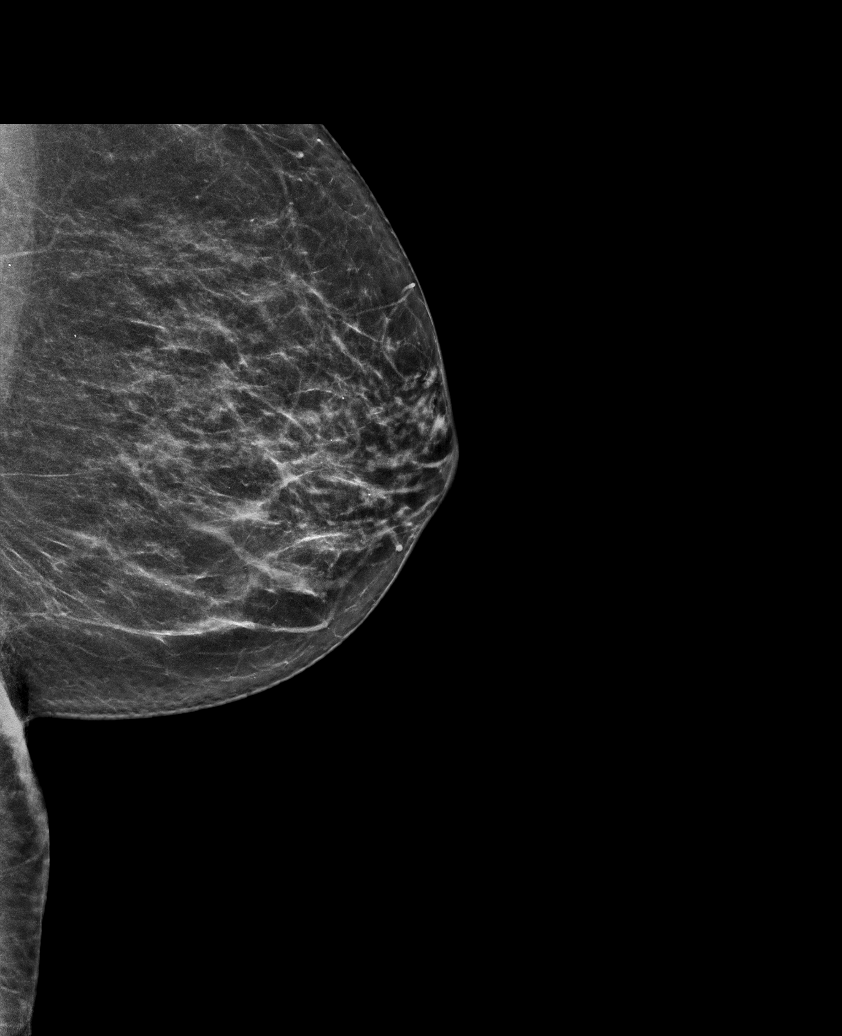

[L CC synth-2D]
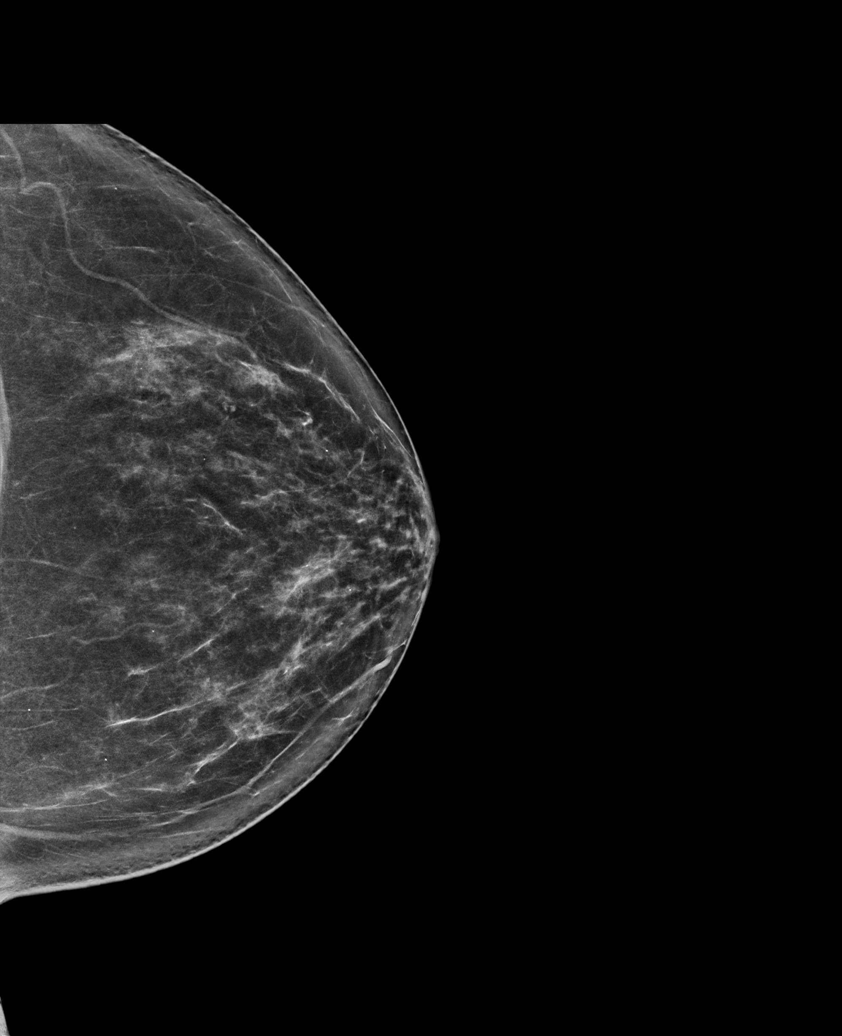

[L CC tomo · tomo slice 41/81.0]
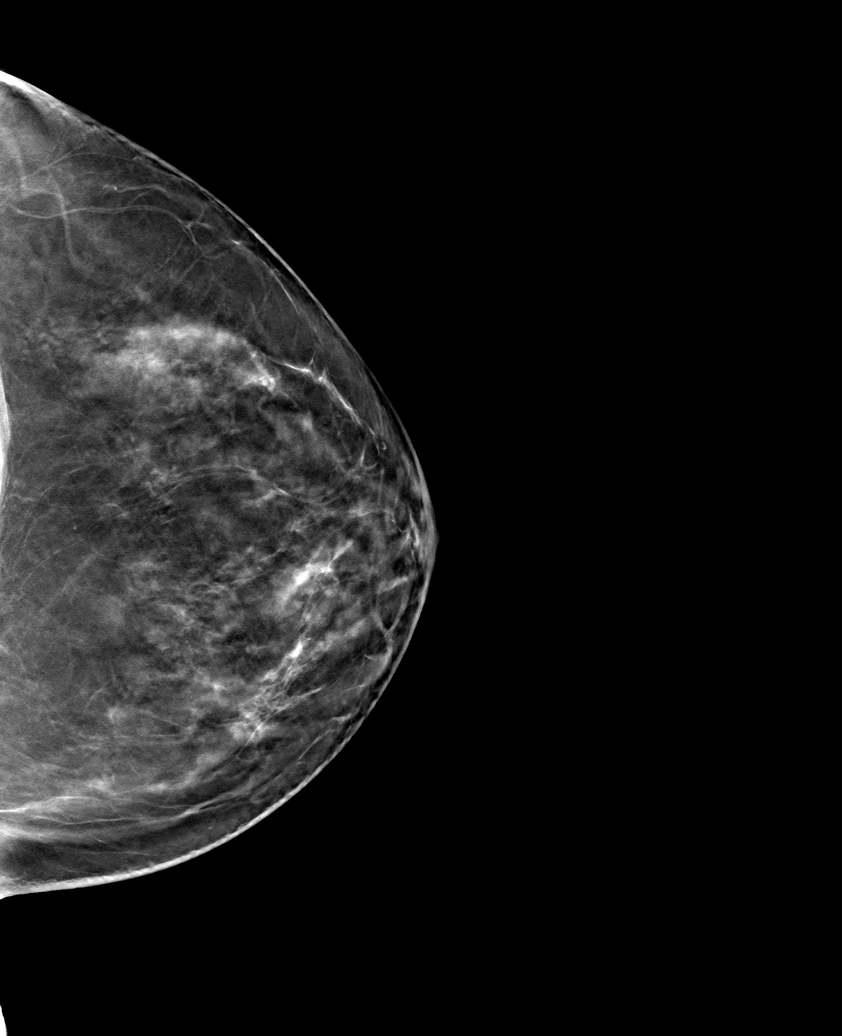

[6 of 30 positions shown; findings below may reference images not displayed]

ACR Breast Density Category b: There are scattered areas of
fibroglandular density.
FINDINGS: There are no findings suspicious for malignancy. Images were
processed with CAD.
IMPRESSION: No mammographic evidence of malignancy. A result letter of this
screening mammogram will be mailed directly to the patient.

RECOMMENDATION:
Screening mammogram in one year. (Code:[id number])

BI-RADS CATEGORY  1: Negative.

## 2020-04-20 ENCOUNTER — Other Ambulatory Visit: Payer: Self-pay | Admitting: Family Medicine

## 2020-04-20 DIAGNOSIS — Z1231 Encounter for screening mammogram for malignant neoplasm of breast: Secondary | ICD-10-CM

## 2020-05-25 ENCOUNTER — Other Ambulatory Visit: Payer: Self-pay

## 2020-05-25 ENCOUNTER — Ambulatory Visit
Admission: RE | Admit: 2020-05-25 | Discharge: 2020-05-25 | Disposition: A | Discharge status: Home / Self Care | Payer: Medicare Other | Source: Ambulatory Visit | Attending: Family Medicine | Admitting: Family Medicine

## 2020-05-25 DIAGNOSIS — Z1231 Encounter for screening mammogram for malignant neoplasm of breast: Secondary | ICD-10-CM | POA: Insufficient documentation

## 2020-06-22 ENCOUNTER — Encounter: Payer: Self-pay | Admitting: Family Medicine

## 2020-06-22 ENCOUNTER — Telehealth (INDEPENDENT_AMBULATORY_CARE_PROVIDER_SITE_OTHER): Payer: Medicare Other | Admitting: Family Medicine

## 2020-06-22 ENCOUNTER — Other Ambulatory Visit: Payer: Self-pay

## 2020-06-22 ENCOUNTER — Other Ambulatory Visit (INDEPENDENT_AMBULATORY_CARE_PROVIDER_SITE_OTHER): Payer: Medicare Other

## 2020-06-22 VITALS — BP 135/80 | HR 87 | Temp 97.4°F | Ht 63.25 in | Wt 155.5 lb

## 2020-06-22 DIAGNOSIS — R059 Cough, unspecified: Secondary | ICD-10-CM | POA: Diagnosis not present

## 2020-06-22 LAB — POC INFLUENZA A&B (BINAX/QUICKVUE)
Influenza A, POC: NEGATIVE
Influenza B, POC: NEGATIVE

## 2020-06-22 MED ORDER — BENZONATATE 200 MG PO CAPS
200.0000 mg | ORAL_CAPSULE | Freq: Two times a day (BID) | ORAL | 0 refills | Status: DC | PRN
Start: 2020-06-22 — End: 2020-08-23

## 2020-06-22 MED ORDER — PROMETHAZINE-DM 6.25-15 MG/5ML PO SYRP
5.0000 mL | ORAL_SOLUTION | Freq: Every evening | ORAL | 0 refills | Status: DC | PRN
Start: 2020-06-22 — End: 2020-08-23

## 2020-06-22 NOTE — Progress Notes (Signed)
Lab appointment set up this afternoon at 2:30 pm for Covid & Flu testing.  Back door information provided.

## 2020-06-22 NOTE — Assessment & Plan Note (Addendum)
Likely COVID19  infection vs other viral infection. No clear sign of bacterial infection at this time.  Recommend testing .Marland KitchenShe will come to [facility] drive [facility] for flua nd COVID testing. No SOB.  No red flags/need for ER visit or in-person exam at respiratory clinic at this time..    Pt moderate risk for COVID complications given  HTN, overweight  and age. If SOB begins symptoms worsening.. have low threshold for in-person exam, if severe shortness of breath ER visit recommended.  Can monitor Oxygen saturation at home with home monitor if able to obtain.  Go to ER if O2 sat < 90% on room air.  Reviewed home care and provided. Pt does not have MyChart acsess.  Recommended quarantine until test returns. If returns positive 5 days isolation recommended. Return to work day 6 and wear mask for 4 more days to complete 10 days. Provided info about prevention of spread of COVID 19.

## 2020-06-22 NOTE — Progress Notes (Signed)
Telephone VISIT Due to national recommendations of social distancing due to [place] 19, a virtual visit is felt to be most appropriate for this patient at this time.   I connected with the patient on 06/22/20 at  9:20 AM EST by  telehealth platform and verified that I am speaking with the correct person using two identifiers.  Interactive audio and video telecommunications were attempted between this provider and patient, however failed, due to patient having technical difficulties OR patient did not have access to video capability.  We continued and completed visit with audio only.     I discussed the limitations, risks, security and privacy concerns of performing an evaluation and management service by  virtual telehealth platform and the availability of in person appointments. I also discussed with the patient that there may be a patient responsible charge related to this service. The patient expressed understanding and agreed to proceed.  Patient location: Home Provider Location: [facility] Participants: Eliezer Lofts and Forde Radon   Chief Complaint  Patient presents with  . Cough  . Sore Throat    History of Present Illness: 75 year old female patient of Dr. Marliss Coots with history of HTN and prediabetes presents with cough and sore throat.   Date of onset of symptoms: 06/18/2020  She started with cough, productive.. clear, no blood, not keeping her up at night.  no congestion  No fever, no chills. Sore throat, mild No SOB, no CP  no myalgia.  no loss of taste or smell.  She is using  Zyrtec, cough drops, vick vapor rub.  COVID 19 screen COVID testing: none COVID vaccine: 02/22/2020 , 08/09/2019 , 07/14/2019 COVID exposure:  mother in law positive for COVID  The importance of social distancing was discussed today.   Review of Systems  Constitutional: Negative for chills and fever.  HENT: Positive for congestion. Negative for ear pain.   Eyes: Negative for pain and  redness.  Respiratory: Positive for cough. Negative for shortness of breath.   Cardiovascular: Negative for chest pain, palpitations and leg swelling.  Gastrointestinal: Negative for abdominal pain, blood in stool, constipation, diarrhea, nausea and vomiting.  Genitourinary: Negative for dysuria.  Musculoskeletal: Negative for falls and myalgias.  Skin: Negative for rash.  Neurological: Negative for dizziness.  Psychiatric/Behavioral: Negative for depression. The patient is not nervous/anxious.        Past Medical History:  Diagnosis Date  . Allergy   . Anemia    slight- no tx as of yet   . Cataract    removed bilat with lens to left eye   . Chest pain   . Diverticulosis of colon    BE mild diverticulosis 01/1995  . Elevated blood sugar level   . Glaucoma 09/24/2005   history of glaucoma (Dr. Darolyn Rua)  . Hyperlipidemia   . Hypertension    eye drops elevated BP- not discontined since per pt.  . Osteopenia   . Ringing in ears    worse with aspirin  . Tinnitus     reports that she has never smoked. She has never used smokeless tobacco. She reports that she does not drink alcohol and does not use drugs.   Current Outpatient Medications:  .  amLODipine (NORVASC) 5 MG tablet, Take 1 tablet (5 mg total) by mouth daily., Disp: 90 tablet, Rfl: 2 .  Cetirizine HCl (ZYRTEC PO), Take 1 tablet by mouth as needed. Reported on 11/08/2015, Disp: , Rfl:  .  fluticasone (FLONASE) 50  MCG/ACT nasal spray, Place 2 sprays into both nostrils daily. Reported on 11/08/2015, Disp: 48 g, Rfl: 1 .  Multiple Vitamin (MULTIVITAMIN) tablet, Take 1 tablet by mouth daily. Centrum silver daily, Disp: , Rfl:    Observations/Objective: Blood pressure 135/80, pulse 87, temperature (!) 97.4 F (36.3 C), temperature source Temporal, height 5' 3.25" (1.607 m), weight 155 lb 8 oz (70.5 kg), last menstrual period 05/19/1993, SpO2 98 %.  Physical Exam  Physical Exam Constitutional:      General: The patient is  not in acute distress. Pulmonary:     Effort: Pulmonary effort is normal. No respiratory distress.  Neurological:     Mental Status: The patient is alert and oriented to person, place, and time.  Psychiatric:        Mood and Affect: Mood normal.        Behavior: Behavior normal.   Assessment and Plan  Problem List Items Addressed This Visit    Cough - Primary     Likely COVID19  infection vs other viral infection. No clear sign of bacterial infection at this time.  Recommend testing .Marland KitchenShe will come to [facility] drive up for flua nd COVID testing. No SOB.  No red flags/need for ER visit or in-person exam at respiratory clinic at this time..    Pt moderate risk for COVID complications given  HTN, overweight  and age. If SOB begins symptoms worsening.. have low threshold for in-person exam, if severe shortness of breath ER visit recommended.  Can monitor Oxygen saturation at home with home monitor if able to obtain.  Go to ER if O2 sat < 90% on room air.  Reviewed home care and provided. Pt does not have MyChart acsess.  Recommended quarantine until test returns. If returns positive 5 days isolation recommended. Return to work day 6 and wear mask for 4 more days to complete 10 days. Provided info about prevention of spread of COVID 19.       Relevant Orders   POC Influenza A&B([facility]/QUICKVUE)   Novel Coronavirus, NAA ([facility])        I discussed the assessment and treatment plan with the patient. The patient was provided an opportunity to ask questions and all were answered. The patient agreed with the plan and demonstrated an understanding of the instructions.   The patient was advised to call back or seek an in-person evaluation if the symptoms worsen or if the condition fails to improve as anticipated.   Total visit time 15 minutes, > 50% spent counseling and cordinating patients care.   Eliezer Lofts, MD

## 2020-06-23 LAB — SARS-COV-2, NAA 2 DAY TAT

## 2020-06-23 LAB — NOVEL CORONAVIRUS, NAA: SARS-CoV-2, NAA: DETECTED — AB

## 2020-06-27 ENCOUNTER — Telehealth: Payer: Self-pay

## 2020-06-27 NOTE — Telephone Encounter (Signed)
Aware, thanks for the update  

## 2020-06-27 NOTE — Telephone Encounter (Signed)
Noted  

## 2020-06-27 NOTE — Telephone Encounter (Signed)
Pt left v/m wanting to know if she could take ASA for H/A. Per allergy list has ASA listed as causing increase in tinnitus. I spoke with pt; pt had video visit with Dr Diona Browner and dx with + covid on 06/22/20. Pt said she also has taken tylenol before and has tylenol at home. I advised pt to take tylenol instead of ASA due to tinnitus. Pt voiced understanding. Pt said she does have a dry cough but the benzonatate is helping during the day and the promethazine DM helps cough at night so pt can rest. Now pt has slight H/A with pain level of 2 - 3.Pt denies fever, chills, CP,SOB, diarrhea, vomiting or other covid symptoms. Pt said she thinks all the phone calls from friends and family is beginning to bother her because they are asking lots of questions about covid and pt does not feel like talking about it and therefore pt is starting to feel a little nervous and jittery. Pt is drinking a lot of fluids, trying to get more rest and is still quarantining. Pt said she will take the tylenol for H/A. Pt does have answering machine and I advised pt if she was getting a lot of calls and pt did not feel up to talking to let the call go to answering machine. Pt voiced understanding and appreciated the concern. Sending the note as FYI to Dr Diona Browner who did video visit on 06/22/20 but is out of office today and to Dr Glori Bickers who is PCP for pt and in office today.

## 2020-07-24 ENCOUNTER — Telehealth: Payer: Self-pay | Admitting: Family Medicine

## 2020-07-24 DIAGNOSIS — R7303 Prediabetes: Secondary | ICD-10-CM

## 2020-07-24 DIAGNOSIS — E7800 Pure hypercholesterolemia, unspecified: Secondary | ICD-10-CM

## 2020-07-24 DIAGNOSIS — I10 Essential (primary) hypertension: Secondary | ICD-10-CM

## 2020-07-24 NOTE — Telephone Encounter (Signed)
-----   Message from Cloyd Stagers, RT sent at 07/20/2020 11:17 AM EST ----- Regarding: Lab Orders for Thursday 3.10.2022 Please place lab orders for Thursday 3.10.2022, office visit for physical on Thursday 3.17.2022 Thank you, Dyke Maes RT(R)

## 2020-07-26 ENCOUNTER — Other Ambulatory Visit (INDEPENDENT_AMBULATORY_CARE_PROVIDER_SITE_OTHER): Payer: Medicare Other

## 2020-07-26 ENCOUNTER — Other Ambulatory Visit: Payer: Self-pay

## 2020-07-26 DIAGNOSIS — E7800 Pure hypercholesterolemia, unspecified: Secondary | ICD-10-CM | POA: Diagnosis not present

## 2020-07-26 DIAGNOSIS — R7303 Prediabetes: Secondary | ICD-10-CM | POA: Diagnosis not present

## 2020-07-26 DIAGNOSIS — I10 Essential (primary) hypertension: Secondary | ICD-10-CM

## 2020-07-26 LAB — COMPREHENSIVE METABOLIC PANEL
ALT: 14 U/L (ref 0–35)
AST: 17 U/L (ref 0–37)
Albumin: 4.1 g/dL (ref 3.5–5.2)
Alkaline Phosphatase: 86 U/L (ref 39–117)
BUN: 13 mg/dL (ref 6–23)
CO2: 32 mEq/L (ref 19–32)
Calcium: 9.6 mg/dL (ref 8.4–10.5)
Chloride: 105 mEq/L (ref 96–112)
Creatinine, Ser: 0.84 mg/dL (ref 0.40–1.20)
GFR: 67.38 mL/min (ref >60.00)
Glucose, Bld: 84 mg/dL (ref 70–99)
Potassium: 4 mEq/L (ref 3.5–5.1)
Sodium: 142 mEq/L (ref 135–145)
Total Bilirubin: 0.3 mg/dL (ref 0.2–1.2)
Total Protein: 7.1 g/dL (ref 6.0–8.3)

## 2020-07-26 LAB — CBC WITH DIFFERENTIAL/PLATELET
Basophils Absolute: 0 10*3/uL (ref 0.0–0.1)
Basophils Relative: 0.9 % (ref 0.0–3.0)
Eosinophils Absolute: 0.1 10*3/uL (ref 0.0–0.7)
Eosinophils Relative: 1.5 % (ref 0.0–5.0)
HCT: 37.6 % (ref 36.0–46.0)
Hemoglobin: 12.2 g/dL (ref 12.0–15.0)
Lymphocytes Relative: 27.5 % (ref 12.0–46.0)
Lymphs Abs: 1.4 10*3/uL (ref 0.7–4.0)
MCHC: 32.4 g/dL (ref 30.0–36.0)
MCV: 80.8 fl (ref 78.0–100.0)
Monocytes Absolute: 0.4 10*3/uL (ref 0.1–1.0)
Monocytes Relative: 7 % (ref 3.0–12.0)
Neutro Abs: 3.3 10*3/uL (ref 1.4–7.7)
Neutrophils Relative %: 63.1 % (ref 43.0–77.0)
Platelets: 283 10*3/uL (ref 150.0–400.0)
RBC: 4.66 Mil/uL (ref 3.87–5.11)
RDW: 15.9 % — ABNORMAL HIGH (ref 11.5–15.5)
WBC: 5.2 10*3/uL (ref 4.0–10.5)

## 2020-07-26 LAB — LIPID PANEL
Cholesterol: 189 mg/dL (ref 0–200)
HDL: 62.2 mg/dL (ref >39.00)
LDL Cholesterol: 107 mg/dL — ABNORMAL HIGH (ref 0–99)
NonHDL: 126.82
Total CHOL/HDL Ratio: 3
Triglycerides: 99 mg/dL (ref 0.0–149.0)
VLDL: 19.8 mg/dL (ref 0.0–40.0)

## 2020-07-26 LAB — TSH: TSH: 1.17 u[IU]/mL (ref 0.35–4.50)

## 2020-07-26 LAB — HEMOGLOBIN A1C: Hgb A1c MFr Bld: 6 % (ref 4.6–6.5)

## 2020-08-02 ENCOUNTER — Encounter: Payer: Medicare Other | Admitting: Family Medicine

## 2020-08-23 ENCOUNTER — Ambulatory Visit (INDEPENDENT_AMBULATORY_CARE_PROVIDER_SITE_OTHER): Payer: Medicare Other | Admitting: Family Medicine

## 2020-08-23 ENCOUNTER — Other Ambulatory Visit: Payer: Self-pay

## 2020-08-23 ENCOUNTER — Encounter: Payer: Self-pay | Admitting: Family Medicine

## 2020-08-23 VITALS — BP 126/68 | HR 76 | Temp 96.9°F | Ht 62.75 in | Wt 151.1 lb

## 2020-08-23 DIAGNOSIS — E7800 Pure hypercholesterolemia, unspecified: Secondary | ICD-10-CM | POA: Diagnosis not present

## 2020-08-23 DIAGNOSIS — Z0000 Encounter for general adult medical examination without abnormal findings: Secondary | ICD-10-CM | POA: Diagnosis not present

## 2020-08-23 DIAGNOSIS — E2839 Other primary ovarian failure: Secondary | ICD-10-CM

## 2020-08-23 DIAGNOSIS — R7303 Prediabetes: Secondary | ICD-10-CM | POA: Diagnosis not present

## 2020-08-23 DIAGNOSIS — M8588 Other specified disorders of bone density and structure, other site: Secondary | ICD-10-CM | POA: Diagnosis not present

## 2020-08-23 DIAGNOSIS — I10 Essential (primary) hypertension: Secondary | ICD-10-CM | POA: Diagnosis not present

## 2020-08-23 MED ORDER — AMLODIPINE BESYLATE 5 MG PO TABS: 5.0000 mg | ORAL_TABLET | Freq: Every day | ORAL | 3 refills | Status: DC

## 2020-08-23 MED ORDER — FLUTICASONE PROPIONATE 50 MCG/ACT NA SUSP: 2.0000 | Freq: Every day | NASAL | 3 refills | Status: DC

## 2020-08-23 NOTE — Assessment & Plan Note (Signed)
dexa ordered  One fall, no fractures  Good exercise  Enc to add 1000 iu more vit D daily

## 2020-08-23 NOTE — Assessment & Plan Note (Signed)
Disc goals for lipids and reasons to control them Rev last labs with pt Rev low sat fat diet in detail Improved LDL with recent diet change

## 2020-08-23 NOTE — Assessment & Plan Note (Signed)
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 directive No cognitive concerns  Hearing screen rev in context for tinnitis-not ready for audiogy eval Eye/vision care is up to date
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
## 2020-08-23 NOTE — Assessment & Plan Note (Signed)
Lab Results  Component Value Date   HGBA1C 6.0 07/26/2020   Stable  Commended low glycemic diet

## 2020-08-23 NOTE — Patient Instructions (Addendum)
If you are interested in the new shingles vaccine (Shingrix) - call your local pharmacy to check on coverage and availability  If affordable, get on a wait list at your pharmacy to get the vaccine.   B12 is ok  Stop the iron  Take the multi vitamin  Add another 1000 iu of vitamin D over the counter   Please work on packet for advance directive and have it notarized  Schedule your bone density test when ready   Come back if the back /buttock /leg pain keep bothering you

## 2020-08-23 NOTE — Assessment & Plan Note (Signed)
bp in fair control at this time  BP Readings from Last 1 Encounters:  08/23/20 126/68   No changes needed Most recent labs reviewed  Disc lifstyle change with low sodium diet and exercise  Plan to continue amlodipine 5 mg daily

## 2020-08-24 DIAGNOSIS — H5213 Myopia, bilateral: Secondary | ICD-10-CM | POA: Diagnosis not present

## 2020-08-24 DIAGNOSIS — Z9841 Cataract extraction status, right eye: Secondary | ICD-10-CM | POA: Diagnosis not present

## 2020-08-24 DIAGNOSIS — Z9842 Cataract extraction status, left eye: Secondary | ICD-10-CM | POA: Diagnosis not present

## 2020-08-30 ENCOUNTER — Ambulatory Visit
Admission: RE | Admit: 2020-08-30 | Discharge: 2020-08-30 | Disposition: A | Discharge status: Home / Self Care | Payer: Medicare Other | Source: Ambulatory Visit | Attending: Family Medicine | Admitting: Family Medicine

## 2020-08-30 ENCOUNTER — Other Ambulatory Visit: Payer: Self-pay

## 2020-08-30 DIAGNOSIS — E2839 Other primary ovarian failure: Secondary | ICD-10-CM | POA: Diagnosis not present

## 2020-08-30 DIAGNOSIS — Z780 Asymptomatic menopausal state: Secondary | ICD-10-CM | POA: Diagnosis not present

## 2020-08-30 DIAGNOSIS — M8588 Other specified disorders of bone density and structure, other site: Secondary | ICD-10-CM | POA: Diagnosis not present

## 2020-09-07 ENCOUNTER — Telehealth: Payer: Self-pay | Admitting: Family Medicine

## 2020-09-07 NOTE — Telephone Encounter (Signed)
Pt called in due to her pharmacy changed to [facility] due to she is under [place] and they changed it this year. And her prescriptions went to [facility].   Please advise

## 2020-09-10 MED ORDER — FLUTICASONE PROPIONATE 50 MCG/ACT NA SUSP: 2.0000 | Freq: Every day | NASAL | 3 refills | Status: DC

## 2020-09-10 MED ORDER — AMLODIPINE BESYLATE 5 MG PO TABS: 5.0000 mg | ORAL_TABLET | Freq: Every day | ORAL | 3 refills | Status: DC

## 2020-09-10 NOTE — Telephone Encounter (Signed)
Rx sent and pt notified.

## 2021-03-05 ENCOUNTER — Ambulatory Visit (INDEPENDENT_AMBULATORY_CARE_PROVIDER_SITE_OTHER): Payer: Medicare Other | Admitting: Internal Medicine

## 2021-03-05 ENCOUNTER — Other Ambulatory Visit: Payer: Self-pay

## 2021-03-05 ENCOUNTER — Encounter: Payer: Self-pay | Admitting: Internal Medicine

## 2021-03-05 VITALS — BP 118/70 | HR 73 | Temp 97.7°F | Ht 62.75 in | Wt 152.0 lb

## 2021-03-05 DIAGNOSIS — R079 Chest pain, unspecified: Secondary | ICD-10-CM | POA: Diagnosis not present

## 2021-03-05 DIAGNOSIS — R1013 Epigastric pain: Secondary | ICD-10-CM | POA: Diagnosis not present

## 2021-03-05 LAB — COMPREHENSIVE METABOLIC PANEL
ALT: 13 U/L (ref 0–35)
AST: 23 U/L (ref 0–37)
Albumin: 4.2 g/dL (ref 3.5–5.2)
Alkaline Phosphatase: 68 U/L (ref 39–117)
BUN: 11 mg/dL (ref 6–23)
CO2: 24 mEq/L (ref 19–32)
Calcium: 9.5 mg/dL (ref 8.4–10.5)
Chloride: 101 mEq/L (ref 96–112)
Creatinine, Ser: 0.79 mg/dL (ref 0.40–1.20)
GFR: 72.22 mL/min (ref >60.00)
Glucose, Bld: 85 mg/dL (ref 70–99)
Potassium: 3.8 mEq/L (ref 3.5–5.1)
Sodium: 139 mEq/L (ref 135–145)
Total Bilirubin: 0.3 mg/dL (ref 0.2–1.2)
Total Protein: 7 g/dL (ref 6.0–8.3)

## 2021-03-05 LAB — CBC
HCT: 38.6 % (ref 36.0–46.0)
Hemoglobin: 12.5 g/dL (ref 12.0–15.0)
MCHC: 32.3 g/dL (ref 30.0–36.0)
MCV: 81 fl (ref 78.0–100.0)
Platelets: 270 10*3/uL (ref 150.0–400.0)
RBC: 4.77 Mil/uL (ref 3.87–5.11)
RDW: 15.3 % (ref 11.5–15.5)
WBC: 4.3 10*3/uL (ref 4.0–10.5)

## 2021-03-05 LAB — T4, FREE: Free T4: 1.09 ng/dL (ref 0.60–1.60)

## 2021-03-05 MED ORDER — OMEPRAZOLE 20 MG PO CPDR: 20.0000 mg | DELAYED_RELEASE_CAPSULE | Freq: Every day | ORAL | 3 refills | Status: DC

## 2021-03-05 NOTE — Assessment & Plan Note (Addendum)
Started as abdominal pain after breakfast and then spread up Burning substernal and with SOB ??throat symptoms Also some palpitations with this I favor this being esophagitis from GERD--but could be angina Will check EKG and labs  EKG shows sinus at 78. Normal axis and intervals. No hypertrophy or ischemia. No change since 02/05/14  Did have normal echo and stress test some years ago

## 2021-03-05 NOTE — Progress Notes (Signed)
Subjective:    Patient ID: Sue Conway, female    DOB: 1946/01/01, 75 y.o.   MRN: [id number]  HPI Here due to abdominal pain With husband This visit occurred during the SARS-CoV-2 public health emergency.  Safety protocols were in place, including screening questions prior to the visit, additional usage of staff PPE, and extensive cleaning of exam room while observing appropriate contact time as indicated for disinfecting solutions.   Had some stomach trouble 2-3 days ago--points to entire abdomen Some better after pepto bismol and rubbing vicks It did happen after eating  breakfast 4 days ago (apple and oatmeal) Pain was "bad"---"Just painful" Now has burning sensation in upper chest----feels like she can't get breath and will pass out when bending over (2 days ago) Did try gas-x as well  Can "feel my heart in my head" Loud noises  No history of heart trouble No regular exercise Husband does most of instrumental ADLs---but she notes no change in exercise tolerance (did move some heavy cinder blocks about a week ago)  Current Outpatient Medications on File Prior to Visit  Medication Sig Dispense Refill   amLODipine (NORVASC) 5 MG tablet Take 1 tablet (5 mg total) by mouth daily. 90 tablet 3   Cetirizine HCl (ZYRTEC PO) Take 1 tablet by mouth as needed. Reported on 11/08/2015     fluticasone (FLONASE) 50 MCG/ACT nasal spray Place 2 sprays into both nostrils daily. Reported on 11/08/2015 48 g 3   Multiple Vitamin (MULTIVITAMIN) tablet Take 1 tablet by mouth daily. Centrum silver daily     vitamin B-12 (CYANOCOBALAMIN) 1000 MCG tablet Take 1,000 mcg by mouth daily.     No current facility-administered medications on file prior to visit.    Allergies  Allergen Reactions   Aspirin     REACTION: worsens her tinnitis   Atorvastatin     Leg muscle pain    Prozac [Fluoxetine Hcl]    Zoloft [Sertraline Hcl]     Past Medical History:  Diagnosis Date   Allergy    Anemia     slight- no tx as of yet    Cataract    removed bilat with lens to left eye    Chest pain    Diverticulosis of colon    BE mild diverticulosis 01/1995   Elevated blood sugar level    Glaucoma 09/24/2005   history of glaucoma (Dr. Darolyn Rua)   Hyperlipidemia    Hypertension    eye drops elevated BP- not discontined since per pt.   Osteopenia    Ringing in ears    worse with aspirin   Tinnitus     Past Surgical History:  Procedure Laterality Date   ABDOMINAL HYSTERECTOMY  1999   total hysterectomy fibroids   BREAST BIOPSY N/A 2013   needle,benign   CATARACT EXTRACTION, BILATERAL     CEREBRAL ANEURYSM REPAIR  02/2003   COLONOSCOPY     FINE NEEDLE ASPIRATION  06/2010   breast   OOPHORECTOMY      Family History  Problem Relation Age of Onset   Diabetes Mother    Hypertension Mother    Heart disease Mother        CHF   Diabetes Father    Diabetes Brother    Heart disease Brother 35       CVA and MI   Breast cancer Neg Hx    Colon cancer Neg Hx    Colon polyps Neg Hx    Esophageal cancer  Neg Hx    Rectal cancer Neg Hx    Stomach cancer Neg Hx     Social History   Socioeconomic History   Marital status: Married    Spouse name: Not on file   Number of children: Not on file   Years of education: Not on file   Highest education level: Not on file  Occupational History   Not on file  Tobacco Use   Smoking status: Never   Smokeless tobacco: Never  Vaping Use   Vaping Use: Never used  Substance and Sexual Activity   Alcohol use: No    Alcohol/week: 0.0 standard drinks   Drug use: No   Sexual activity: Never  Other Topics Concern   Not on file  Social History Narrative   Not on file   Social Determinants of Health   Financial Resource Strain: Not on file  Food Insecurity: Not on file  Transportation Needs: Not on file  Physical Activity: Not on file  Stress: Not on file  Social Connections: Not on file  Intimate Partner Violence: Not on file    Review of Systems No dysphagia No past heartburn No vomiting. Some nausea with abdominal pain Bowels are slow---now just sipping on broth (afraid to eat more)    Objective:   Physical Exam Constitutional:      Appearance: She is well-developed.  Cardiovascular:     Rate and Rhythm: Normal rate and regular rhythm.     Heart sounds: No murmur heard.   No gallop.  Pulmonary:     Effort: Pulmonary effort is normal.     Breath sounds: Normal breath sounds. No wheezing or rales.     Comments: Some sternal tenderness Abdominal:     General: Bowel sounds are normal.     Palpations: Abdomen is soft. There is no mass.     Tenderness: There is no guarding or rebound.     Comments: Slight epigastric sensitivity  Musculoskeletal:     Cervical back: Neck supple.     Right lower leg: No edema.     Left lower leg: No edema.  Lymphadenopathy:     Cervical: No cervical adenopathy.  Neurological:     Mental Status: She is alert.  Psychiatric:        Mood and Affect: Mood normal.           Assessment & Plan:

## 2021-03-05 NOTE — Assessment & Plan Note (Signed)
Started after eating Some better with pepto bismol Past dyspepsia in records Will try PPI to see if symptoms resolve---likely related to the chest pain which came on after this

## 2021-03-05 NOTE — Patient Instructions (Signed)
Please start the omepazole 20mg  every day on an empty stomach (before breakfast or at bedtime). If you have severe chest pain again or shortness of breath--call [phone]

## 2021-03-06 DIAGNOSIS — R079 Chest pain, unspecified: Secondary | ICD-10-CM | POA: Diagnosis not present

## 2021-03-06 DIAGNOSIS — I10 Essential (primary) hypertension: Secondary | ICD-10-CM | POA: Diagnosis not present

## 2021-03-06 DIAGNOSIS — R918 Other nonspecific abnormal finding of lung field: Secondary | ICD-10-CM | POA: Diagnosis not present

## 2021-03-06 DIAGNOSIS — Z79899 Other long term (current) drug therapy: Secondary | ICD-10-CM | POA: Diagnosis not present

## 2021-03-06 DIAGNOSIS — R9431 Abnormal electrocardiogram [ECG] [EKG]: Secondary | ICD-10-CM | POA: Diagnosis not present

## 2021-03-06 DIAGNOSIS — R0789 Other chest pain: Secondary | ICD-10-CM | POA: Diagnosis not present

## 2021-03-07 DIAGNOSIS — R918 Other nonspecific abnormal finding of lung field: Secondary | ICD-10-CM | POA: Diagnosis not present

## 2021-03-07 DIAGNOSIS — R079 Chest pain, unspecified: Secondary | ICD-10-CM | POA: Diagnosis not present

## 2021-05-03 ENCOUNTER — Other Ambulatory Visit: Payer: Self-pay | Admitting: Family Medicine

## 2021-05-03 DIAGNOSIS — Z1231 Encounter for screening mammogram for malignant neoplasm of breast: Secondary | ICD-10-CM

## 2021-05-06 ENCOUNTER — Telehealth: Payer: Self-pay | Admitting: Family Medicine

## 2021-05-06 NOTE — Telephone Encounter (Signed)
°  Encourage patient to contact the pharmacy for refills or they can request refills through [place]:  Please schedule appointment if longer than 1 year  NEXT APPOINTMENT DATE:  MEDICATION: amlodipine  Is the patient out of medication? yes   PHARMACY: [facility]  Let patient know to contact pharmacy at the end of the day to make sure medication is ready.  Please notify patient to allow 48-72 hours to process  CLINICAL FILLS OUT ALL BELOW:   LAST REFILL:  QTY:  REFILL DATE:    OTHER COMMENTS:    Okay for refill?  Please advise

## 2021-05-07 MED ORDER — AMLODIPINE BESYLATE 5 MG PO TABS: 5.0000 mg | ORAL_TABLET | Freq: Every day | ORAL | 0 refills | Status: DC

## 2021-05-07 NOTE — Addendum Note (Signed)
Addended by: Tammi Sou on: 05/07/2021 09:33 AM   Modules accepted: Orders

## 2021-05-28 ENCOUNTER — Other Ambulatory Visit: Payer: Self-pay

## 2021-05-28 ENCOUNTER — Ambulatory Visit
Admission: RE | Admit: 2021-05-28 | Discharge: 2021-05-28 | Disposition: A | Discharge status: Home / Self Care | Payer: Medicare Other | Source: Ambulatory Visit | Attending: Family Medicine | Admitting: Family Medicine

## 2021-05-28 DIAGNOSIS — Z1231 Encounter for screening mammogram for malignant neoplasm of breast: Secondary | ICD-10-CM | POA: Diagnosis not present

## 2021-05-31 ENCOUNTER — Other Ambulatory Visit: Payer: Self-pay | Admitting: Internal Medicine

## 2021-05-31 NOTE — Telephone Encounter (Signed)
Left message on vm to see if she is taking the omeprazole and if it has helped since Dr Silvio Pate had her restart it in October.

## 2021-07-15 DIAGNOSIS — F331 Major depressive disorder, recurrent, moderate: Secondary | ICD-10-CM | POA: Diagnosis not present

## 2021-08-05 DIAGNOSIS — F331 Major depressive disorder, recurrent, moderate: Secondary | ICD-10-CM | POA: Diagnosis not present

## 2021-09-02 DIAGNOSIS — F331 Major depressive disorder, recurrent, moderate: Secondary | ICD-10-CM | POA: Diagnosis not present

## 2021-09-05 DIAGNOSIS — Z9841 Cataract extraction status, right eye: Secondary | ICD-10-CM | POA: Diagnosis not present

## 2021-09-05 DIAGNOSIS — Z9842 Cataract extraction status, left eye: Secondary | ICD-10-CM | POA: Diagnosis not present

## 2021-09-05 DIAGNOSIS — H5213 Myopia, bilateral: Secondary | ICD-10-CM | POA: Diagnosis not present

## 2021-09-19 ENCOUNTER — Ambulatory Visit (INDEPENDENT_AMBULATORY_CARE_PROVIDER_SITE_OTHER): Payer: Medicare Other | Admitting: *Deleted

## 2021-09-19 DIAGNOSIS — Z0000 Encounter for general adult medical examination without abnormal findings: Secondary | ICD-10-CM

## 2021-09-19 NOTE — Patient Instructions (Signed)
Sue Conway , ?Thank you for taking time to come for your Medicare Wellness Visit. I appreciate your ongoing commitment to your health goals. Please review the following plan we discussed and let me know if I can assist you in the future.  ? ?Screening recommendations/referrals: ?Colonoscopy: no longer required ?Mammogram: up to date ?Bone Density: up to date ?Recommended yearly ophthalmology/optometry visit for glaucoma screening and checkup ?Recommended yearly dental visit for hygiene and checkup ? ?Vaccinations: ?Influenza vaccine: up to date ?Pneumococcal vaccine: up to date ?Tdap vaccine: Education provided ?Shingles vaccine: Education provided   ? ?Advanced directives: Education provided ? ?Conditions/risks identified:  ? ? ? ?Preventive Care 76 Years and Older, Female ?Preventive care refers to lifestyle choices and visits with your health care provider that can promote health and wellness. ?What does preventive care include? ?A yearly physical exam. This is also called an annual well check. ?Dental exams once or twice a year. ?Routine eye exams. Ask your health care provider how often you should have your eyes checked. ?Personal lifestyle choices, including: ?Daily care of your teeth and gums. ?Regular physical activity. ?Eating a healthy diet. ?Avoiding tobacco and drug use. ?Limiting alcohol use. ?Practicing safe sex. ?Taking low-dose aspirin every day. ?Taking vitamin and mineral supplements as recommended by your health care provider. ?What happens during an annual well check? ?The services and screenings done by your health care provider during your annual well check will depend on your age, overall health, lifestyle risk factors, and family history of disease. ?Counseling  ?Your health care provider may ask you questions about your: ?Alcohol use. ?Tobacco use. ?Drug use. ?Emotional well-being. ?Home and relationship well-being. ?Sexual activity. ?Eating habits. ?History of falls. ?Memory and ability to  understand (cognition). ?Work and work Statistician. ?Reproductive health. ?Screening  ?You may have the following tests or measurements: ?Height, weight, and BMI. ?Blood pressure. ?Lipid and cholesterol levels. These may be checked every 5 years, or more frequently if you are over 40 years old. ?Skin check. ?Lung cancer screening. You may have this screening every year starting at age 40 if you have a 30-pack-year history of smoking and currently smoke or have quit within the past 15 years. ?Fecal occult blood test (FOBT) of the stool. You may have this test every year starting at age 9. ?Flexible sigmoidoscopy or colonoscopy. You may have a sigmoidoscopy every 5 years or a colonoscopy every 10 years starting at age 90. ?Hepatitis C blood test. ?Hepatitis B blood test. ?Sexually transmitted disease (STD) testing. ?Diabetes screening. This is done by checking your blood sugar (glucose) after you have not eaten for a while (fasting). You may have this done every 1-3 years. ?Bone density scan. This is done to screen for osteoporosis. You may have this done starting at age 12. ?Mammogram. This may be done every 1-2 years. Talk to your health care provider about how often you should have regular mammograms. ?Talk with your health care provider about your test results, treatment options, and if necessary, the need for more tests. ?Vaccines  ?Your health care provider may recommend certain vaccines, such as: ?Influenza vaccine. This is recommended every year. ?Tetanus, diphtheria, and acellular pertussis (Tdap, Td) vaccine. You may need a Td booster every 10 years. ?Zoster vaccine. You may need this after age 41. ?Pneumococcal 13-valent conjugate (PCV13) vaccine. One dose is recommended after age 15. ?Pneumococcal polysaccharide (PPSV23) vaccine. One dose is recommended after age 70. ?Talk to your health care provider about which screenings and vaccines you need  and how often you need them. ?This information is not  intended to replace advice given to you by your health care provider. Make sure you discuss any questions you have with your health care provider. ?Document Released: 06/01/2015 Document Revised: 01/23/2016 Document Reviewed: 03/06/2015 ?Elsevier Interactive Patient Education ? 2017 [employer]. ? ?Fall Prevention in the Home ?Falls can cause injuries. They can happen to people of all ages. There are many things you can do to make your home safe and to help prevent falls. ?What can I do on the outside of my home? ?Regularly fix the edges of walkways and driveways and fix any cracks. ?Remove anything that might make you trip as you walk through a door, such as a raised step or threshold. ?Trim any bushes or trees on the path to your home. ?Use bright outdoor lighting. ?Clear any walking paths of anything that might make someone trip, such as rocks or tools. ?Regularly check to see if handrails are loose or broken. Make sure that both sides of any steps have handrails. ?Any raised decks and porches should have guardrails on the edges. ?Have any leaves, snow, or ice cleared regularly. ?Use sand or salt on walking paths during winter. ?Clean up any spills in your garage right away. This includes oil or grease spills. ?What can I do in the bathroom? ?Use night lights. ?Install grab bars by the toilet and in the tub and shower. Do not use towel bars as grab bars. ?Use non-skid mats or decals in the tub or shower. ?If you need to sit down in the shower, use a plastic, non-slip stool. ?Keep the floor dry. Clean up any water that spills on the floor as soon as it happens. ?Remove soap buildup in the tub or shower regularly. ?Attach bath mats securely with double-sided non-slip rug tape. ?Do not have throw rugs and other things on the floor that can make you trip. ?What can I do in the bedroom? ?Use night lights. ?Make sure that you have a light by your bed that is easy to reach. ?Do not use any sheets or blankets that are  too big for your bed. They should not hang down onto the floor. ?Have a firm chair that has side arms. You can use this for support while you get dressed. ?Do not have throw rugs and other things on the floor that can make you trip. ?What can I do in the kitchen? ?Clean up any spills right away. ?Avoid walking on wet floors. ?Keep items that you use a lot in easy-to-reach places. ?If you need to reach something above you, use a strong step stool that has a grab bar. ?Keep electrical cords out of the way. ?Do not use floor polish or wax that makes floors slippery. If you must use wax, use non-skid floor wax. ?Do not have throw rugs and other things on the floor that can make you trip. ?What can I do with my stairs? ?Do not leave any items on the stairs. ?Make sure that there are handrails on both sides of the stairs and use them. Fix handrails that are broken or loose. Make sure that handrails are as long as the stairways. ?Check any carpeting to make sure that it is firmly attached to the stairs. Fix any carpet that is loose or worn. ?Avoid having throw rugs at the top or bottom of the stairs. If you do have throw rugs, attach them to the floor with carpet tape. ?Make sure that you  have a light switch at the top of the stairs and the bottom of the stairs. If you do not have them, ask someone to add them for you. ?What else can I do to help prevent falls? ?Wear shoes that: ?Do not have high heels. ?Have rubber bottoms. ?Are comfortable and fit you well. ?Are closed at the toe. Do not wear sandals. ?If you use a stepladder: ?Make sure that it is fully opened. Do not climb a closed stepladder. ?Make sure that both sides of the stepladder are locked into place. ?Ask someone to hold it for you, if possible. ?Clearly mark and make sure that you can see: ?Any grab bars or handrails. ?First and last steps. ?Where the edge of each step is. ?Use tools that help you move around (mobility aids) if they are needed. These  include: ?Canes. ?Walkers. ?Scooters. ?Crutches. ?Turn on the lights when you go into a dark area. Replace any light bulbs as soon as they burn out. ?Set up your furniture so you have a clear path. Avoid moving your

## 2021-09-19 NOTE — Progress Notes (Signed)
? ?Subjective:  ? Sue Conway is a 76 y.o. female who presents for Medicare Annual (Subsequent) preventive examination. ? ?I connected with  Forde Radon on 09/19/21 by a telephone enabled telemedicine application and verified that I am speaking with the correct person using two identifiers. ?  ?I discussed the limitations of evaluation and management by telemedicine. The patient expressed understanding and agreed to proceed. ? ?Patient location: home ? ?Provider location: Tele-Health -home ? ? ? ?Review of Systems    ? ?Cardiac Risk Factors include: advanced age (>20mn, >>55women);hypertension;family history of premature cardiovascular disease ? ?   ?Objective:  ?  ?Today's Vitals  ? ?There is no height or weight on file to calculate BMI. ? ? ?  09/19/2021  ? 10:01 AM 11/08/2019  ?  7:58 PM 05/18/2019  ?  9:03 AM 05/05/2018  ?  9:02 AM 11/08/2015  ? 11:16 AM 04/11/2015  ?  2:23 PM  ?Advanced Directives  ?Does Patient Have a Medical Advance Directive? No No No No No No  ?Would patient like information on creating a medical advance directive? No - Patient declined No - Patient declined No - Patient declined No - Patient declined No - patient declined information No - patient declined information  ? ? ?Current Medications (verified) ?Outpatient Encounter Medications as of 09/19/2021  ?Medication Sig  ? amLODipine (NORVASC) 5 MG tablet Take 1 tablet (5 mg total) by mouth daily.  ? Cetirizine HCl (ZYRTEC PO) Take 1 tablet by mouth as needed. Reported on 11/08/2015  ? fluticasone (FLONASE) 50 MCG/ACT nasal spray Place 2 sprays into both nostrils daily. Reported on 11/08/2015  ? Multiple Vitamin (MULTIVITAMIN) tablet Take 1 tablet by mouth daily. Centrum silver daily  ? omeprazole (PRILOSEC) 20 MG capsule TAKE 1 CAPSULE BY MOUTH EVERY DAY  ? vitamin B-12 (CYANOCOBALAMIN) 1000 MCG tablet Take 1,000 mcg by mouth daily.  ? ?No facility-administered encounter medications on file as of 09/19/2021.  ? ? ?Allergies  (verified) ?Aspirin, Atorvastatin, Prozac [fluoxetine hcl], and Zoloft [sertraline hcl]  ? ?History: ?Past Medical History:  ?Diagnosis Date  ? Allergy   ? Anemia   ? slight- no tx as of yet   ? Cataract   ? removed bilat with lens to left eye   ? Chest pain   ? Diverticulosis of colon   ? BE mild diverticulosis 01/1995  ? Elevated blood sugar level   ? Glaucoma 09/24/2005  ? history of glaucoma (Dr. TDarolyn Rua  ? Hyperlipidemia   ? Hypertension   ? eye drops elevated BP- not discontined since per pt.  ? Osteopenia   ? Ringing in ears   ? worse with aspirin  ? Tinnitus   ? ?Past Surgical History:  ?Procedure Laterality Date  ? ABDOMINAL HYSTERECTOMY  1999  ? total hysterectomy fibroids  ? BREAST BIOPSY N/A 2013  ? needle,benign  ? CATARACT EXTRACTION, BILATERAL    ? CEREBRAL ANEURYSM REPAIR  02/2003  ? COLONOSCOPY    ? FINE NEEDLE ASPIRATION  06/2010  ? breast  ? OOPHORECTOMY    ? ?Family History  ?Problem Relation Age of Onset  ? Diabetes Mother   ? Hypertension Mother   ? Heart disease Mother   ?     CHF  ? Diabetes Father   ? Diabetes Brother   ? Heart disease Brother 458 ?     CVA and MI  ? Breast cancer Neg Hx   ? Colon cancer Neg Hx   ?  Colon polyps Neg Hx   ? Esophageal cancer Neg Hx   ? Rectal cancer Neg Hx   ? Stomach cancer Neg Hx   ? ?Social History  ? ?Socioeconomic History  ? Marital status: Married  ?  Spouse name: Not on file  ? Number of children: Not on file  ? Years of education: Not on file  ? Highest education level: Not on file  ?Occupational History  ? Not on file  ?Tobacco Use  ? Smoking status: Never  ? Smokeless tobacco: Never  ?Vaping Use  ? Vaping Use: Never used  ?Substance and Sexual Activity  ? Alcohol use: No  ?  Alcohol/week: 0.0 standard drinks  ? Drug use: No  ? Sexual activity: Never  ?Other Topics Concern  ? Not on file  ?Social History Narrative  ? Not on file  ? ?Social Determinants of Health  ? ?Financial Resource Strain: Low Risk   ? Difficulty of Paying Living Expenses: Not  hard at all  ?Food Insecurity: No Food Insecurity  ? Worried About Charity fundraiser in the Last Year: Never true  ? Ran Out of Food in the Last Year: Never true  ?Transportation Needs: No Transportation Needs  ? Lack of Transportation (Medical): No  ? Lack of Transportation (Non-Medical): No  ?Physical Activity: Insufficiently Active  ? Days of Exercise per Week: 3 days  ? Minutes of Exercise per Session: 30 min  ?Stress: No Stress Concern Present  ? Feeling of Stress : Not at all  ?Social Connections: Socially Integrated  ? Frequency of Communication with Friends and Family: Twice a week  ? Frequency of Social Gatherings with Friends and Family: Twice a week  ? Attends Religious Services: More than 4 times per year  ? Active Member of Clubs or Organizations: Yes  ? Attends Archivist Meetings: More than 4 times per year  ? Marital Status: Married  ? ? ?Tobacco Counseling ?Counseling given: Not Answered ? ? ?Clinical Intake: ? ?Pre-visit preparation completed: Yes ? ?Pain : No/denies pain ? ?  ? ?Nutritional Risks: None ?Diabetes: No ? ?How often do you need to have someone help you when you read instructions, pamphlets, or other written materials from your doctor or pharmacy?: 1 - Never ? ?Diabetic?   no ? ?Interpreter Needed?: No ? ?Information entered by :: Leroy Kennedy LPN ? ? ?Activities of Daily Living ? ?  09/19/2021  ? 10:06 AM 03/05/2021  ? 10:01 AM  ?In your present state of health, do you have any difficulty performing the following activities:  ?Hearing? 0 1  ?Vision? 0 0  ?Difficulty concentrating or making decisions? 0 0  ?Walking or climbing stairs? 0 0  ?Dressing or bathing? 0 0  ?Doing errands, shopping? 0 0  ?Preparing Food and eating ? N   ?Using the Toilet? N   ?In the past six months, have you accidently leaked urine? N   ?Do you have problems with loss of bowel control? N   ?Managing your Medications? N   ?Managing your Finances? N   ?Housekeeping or managing your Housekeeping? N    ? ? ?Patient Care Team: ?Tower, Wynelle Fanny, MD as PCP - General ?Thelma Comp, [place] as Referring Physician (Optometry) ? ?Indicate any recent Medical Services you may have received from other than [facility] providers in the past year (date may be approximate). ? ?   ?Assessment:  ? This is a routine wellness examination for [facility]. ? ?Hearing/Vision screen ?Hearing  Screening - Comments:: No trouble hearing ?tinitus ?Vision Screening - Comments:: [place] ?Up to date ? ?Dietary issues and exercise activities discussed: ?Current Exercise Habits: Home exercise routine, Type of exercise: walking, Time (Minutes): 20, Frequency (Times/Week): 3, Weekly Exercise (Minutes/Week): 60 ? ? Goals Addressed   ? ?  ?  ?  ?  ? This Visit's Progress  ?  Patient Stated   On track  ?  Starting 05/05/2018, I will continue to take medications as prescribed.  ? ?  ?  Patient Stated     ?  Continue current lifestyle  ?  ? ?  ? ?Depression Screen ? ?  09/19/2021  ? 10:11 AM 08/23/2020  ? 10:25 AM 05/18/2019  ?  9:07 AM 05/05/2018  ?  9:01 AM 05/04/2017  ? 11:01 AM 11/08/2015  ? 11:17 AM 06/06/2014  ? 10:06 AM  ?PHQ 2/9 Scores  ?PHQ - 2 Score 2 0 0 0 0 0 2  ?PHQ- 9 Score 2  0 0 1  6  ?  ?Fall Risk ? ?  09/19/2021  ? 10:03 AM 08/23/2020  ? 10:25 AM 05/18/2019  ?  9:05 AM 05/05/2018  ?  9:01 AM 05/04/2017  ? 11:01 AM  ?Fall Risk   ?Falls in the past year? 0 1 0 0 No  ?Number falls in past yr: 0 0 0    ?Injury with Fall? 0 0 0    ?Risk for fall due to :   Medication side effect    ?Follow up Falls evaluation completed;Education provided;Falls prevention discussed Falls evaluation completed Falls evaluation completed;Falls prevention discussed    ? ? ?FALL RISK PREVENTION PERTAINING TO THE HOME: ? ?Any stairs in or around the home? No  ?If so, are there any without handrails? No  ?Home free of loose throw rugs in walkways, pet beds, electrical cords, etc? Yes  ?Adequate lighting in your home to reduce risk of falls? Yes  ? ?ASSISTIVE DEVICES UTILIZED  TO PREVENT FALLS: ? ?Life alert? No  ?Use of a cane, walker or w/c? No  ?Grab bars in the bathroom? No  ?Shower chair or bench in shower? No  ?Elevated toilet seat or a handicapped toilet? No  ? ?TIMED UP AND GO: ? ?Was

## 2021-09-30 ENCOUNTER — Other Ambulatory Visit: Payer: Self-pay

## 2021-09-30 MED ORDER — AMLODIPINE BESYLATE 5 MG PO TABS: 5.0000 mg | ORAL_TABLET | Freq: Every day | ORAL | 0 refills | Status: DC

## 2021-09-30 NOTE — Addendum Note (Signed)
Addended by: Loura Pardon A on: 09/30/2021 05:38 PM ? ? Modules accepted: Orders ? ?

## 2021-09-30 NOTE — Addendum Note (Signed)
Addended by: Tammi Sou on: 09/30/2021 03:17 PM ? ? Modules accepted: Orders ? ?

## 2021-09-30 NOTE — Telephone Encounter (Signed)
Pt hasn't been seen in over a year by PCP and no future appts., please advise  ?

## 2021-09-30 NOTE — Telephone Encounter (Signed)
Received refill request below patient last visit with Dr. Glori Bickers over a year.  ? ? ?

## 2021-09-30 NOTE — Telephone Encounter (Signed)
Refilled once  ?Please schedule f/u this summer  ?

## 2021-10-16 DIAGNOSIS — F331 Major depressive disorder, recurrent, moderate: Secondary | ICD-10-CM | POA: Diagnosis not present

## 2021-11-06 ENCOUNTER — Ambulatory Visit (INDEPENDENT_AMBULATORY_CARE_PROVIDER_SITE_OTHER): Payer: Medicare Other | Admitting: Family Medicine

## 2021-11-06 ENCOUNTER — Encounter: Payer: Self-pay | Admitting: Family Medicine

## 2021-11-06 VITALS — BP 128/82 | HR 87 | Ht 62.75 in | Wt 150.2 lb

## 2021-11-06 DIAGNOSIS — F430 Acute stress reaction: Secondary | ICD-10-CM | POA: Diagnosis not present

## 2021-11-06 DIAGNOSIS — E7800 Pure hypercholesterolemia, unspecified: Secondary | ICD-10-CM | POA: Diagnosis not present

## 2021-11-06 DIAGNOSIS — I10 Essential (primary) hypertension: Secondary | ICD-10-CM

## 2021-11-06 DIAGNOSIS — R7303 Prediabetes: Secondary | ICD-10-CM | POA: Diagnosis not present

## 2021-11-06 DIAGNOSIS — Z636 Dependent relative needing care at home: Secondary | ICD-10-CM

## 2021-11-06 MED ORDER — VENLAFAXINE HCL ER 37.5 MG PO CP24: 37.5000 mg | ORAL_CAPSULE | Freq: Every day | ORAL | 3 refills | Status: DC

## 2021-11-06 NOTE — Assessment & Plan Note (Signed)
Wife and guardian of pt with schizophrenia  Very difficult  This may worsen with age Sue Conway how much she can tolerate Has short term place to go if unsafe  I do wonder legally what her options are if she had to leave SW consult sent /pt agreed to this

## 2021-11-06 NOTE — Assessment & Plan Note (Signed)
a1c ordered  disc imp of low glycemic diet and wt loss to prevent DM2  

## 2021-11-06 NOTE — Assessment & Plan Note (Signed)
bp in fair control at this time  BP Readings from Last 1 Encounters:  11/06/21 128/82   No changes needed Most recent labs reviewed  Disc lifstyle change with low sodium diet and exercise  Plan to continue amlodipine 5 mg daily  Labs today
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
## 2021-11-06 NOTE — Patient Instructions (Signed)
Continue counseling  I will consult social work to see if there is some help for you  You will likely get a call in the next 2 weeks   If you feel unsafe -please leave the house to stay with a sister  Keep [place] posted    Start effexor xr 37.5 mg daily  If any side effects (intolerable)-then hold it and let me know It takes a while to work   If you feel worse or suicidal - let us know right away (go to ER if you need to)  Get out and exercise as much as you can   Follow up here with me in 3-4 weeks   Labs today
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
## 2021-11-06 NOTE — Assessment & Plan Note (Signed)
Disc goals for lipids and reasons to control them Rev last labs with pt Rev low sat fat diet in detail Diet controlled Labs ordered

## 2021-11-07 ENCOUNTER — Telehealth: Payer: Self-pay | Admitting: *Deleted

## 2021-11-07 LAB — CBC WITH DIFFERENTIAL/PLATELET
Basophils Absolute: 0.1 10*3/uL (ref 0.0–0.1)
Basophils Relative: 1 % (ref 0.0–3.0)
Eosinophils Absolute: 0 10*3/uL (ref 0.0–0.7)
Eosinophils Relative: 0.5 % (ref 0.0–5.0)
HCT: 37.6 % (ref 36.0–46.0)
Hemoglobin: 12.1 g/dL (ref 12.0–15.0)
Lymphocytes Relative: 21.1 % (ref 12.0–46.0)
Lymphs Abs: 1.2 10*3/uL (ref 0.7–4.0)
MCHC: 32.1 g/dL (ref 30.0–36.0)
MCV: 82.8 fl (ref 78.0–100.0)
Monocytes Absolute: 0.4 10*3/uL (ref 0.1–1.0)
Monocytes Relative: 7.6 % (ref 3.0–12.0)
Neutro Abs: 3.8 10*3/uL (ref 1.4–7.7)
Neutrophils Relative %: 69.8 % (ref 43.0–77.0)
Platelets: 244 10*3/uL (ref 150.0–400.0)
RBC: 4.54 Mil/uL (ref 3.87–5.11)
RDW: 15.5 % (ref 11.5–15.5)
WBC: 5.5 10*3/uL (ref 4.0–10.5)

## 2021-11-07 LAB — COMPREHENSIVE METABOLIC PANEL
ALT: 10 U/L (ref 0–35)
AST: 16 U/L (ref 0–37)
Albumin: 4.2 g/dL (ref 3.5–5.2)
Alkaline Phosphatase: 73 U/L (ref 39–117)
BUN: 12 mg/dL (ref 6–23)
CO2: 28 mEq/L (ref 19–32)
Calcium: 9.4 mg/dL (ref 8.4–10.5)
Chloride: 104 mEq/L (ref 96–112)
Creatinine, Ser: 0.81 mg/dL (ref 0.40–1.20)
GFR: 69.75 mL/min (ref >60.00)
Glucose, Bld: 88 mg/dL (ref 70–99)
Potassium: 4.7 mEq/L (ref 3.5–5.1)
Sodium: 139 mEq/L (ref 135–145)
Total Bilirubin: 0.3 mg/dL (ref 0.2–1.2)
Total Protein: 7 g/dL (ref 6.0–8.3)

## 2021-11-07 LAB — LIPID PANEL
Cholesterol: 185 mg/dL (ref 0–200)
HDL: 73.7 mg/dL (ref >39.00)
LDL Cholesterol: 92 mg/dL (ref 0–99)
NonHDL: 111.01
Total CHOL/HDL Ratio: 3
Triglycerides: 94 mg/dL (ref 0.0–149.0)
VLDL: 18.8 mg/dL (ref 0.0–40.0)

## 2021-11-07 LAB — HEMOGLOBIN A1C: Hgb A1c MFr Bld: 6 % (ref 4.6–6.5)

## 2021-11-07 LAB — TSH: TSH: 0.42 u[IU]/mL (ref 0.35–5.50)

## 2021-11-07 NOTE — Chronic Care Management (AMB) (Signed)
  Care Management   Note  11/07/2021 Name: Sue Conway MRN: [id number] DOB: 1945-09-06  Sue Conway is a 76 y.o. year old female who is a primary care patient of Tower, Wynelle Fanny, MD. I reached out to Forde Radon by phone today offer care coordination services.   [name] was given information about care management services today including:  Care management services include personalized support from designated clinical staff supervised by her physician, including individualized plan of care and coordination with other care providers 24/7 contact phone numbers for assistance for urgent and routine care needs. The patient may stop care management services at any time by phone call to the office staff.  Patient agreed to services and verbal consent obtained.   Follow up plan: Telephone appointment with care management team member scheduled for: 11/15/2021  Julian Hy, [place]  Direct Dial: [phone]

## 2021-11-08 ENCOUNTER — Telehealth: Payer: Self-pay

## 2021-11-15 ENCOUNTER — Ambulatory Visit: Payer: Medicare Other | Admitting: *Deleted

## 2021-11-15 DIAGNOSIS — F430 Acute stress reaction: Secondary | ICD-10-CM

## 2021-11-15 DIAGNOSIS — Z636 Dependent relative needing care at home: Secondary | ICD-10-CM
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
## 2021-11-27 DIAGNOSIS — F331 Major depressive disorder, recurrent, moderate: Secondary | ICD-10-CM | POA: Diagnosis not present

## 2021-11-28 ENCOUNTER — Other Ambulatory Visit: Payer: Self-pay | Admitting: Family Medicine

## 2021-11-28 ENCOUNTER — Other Ambulatory Visit: Payer: Self-pay | Admitting: Internal Medicine

## 2021-11-28 NOTE — Telephone Encounter (Signed)
Pt is requesting a 90 days is this okay

## 2021-12-06 ENCOUNTER — Ambulatory Visit: Payer: Medicare Other | Admitting: Family Medicine

## 2021-12-09 ENCOUNTER — Ambulatory Visit (INDEPENDENT_AMBULATORY_CARE_PROVIDER_SITE_OTHER): Payer: Medicare Other | Admitting: Family Medicine

## 2021-12-09 ENCOUNTER — Encounter: Payer: Self-pay | Admitting: Family Medicine

## 2021-12-09 VITALS — BP 118/64 | HR 76 | Temp 97.5°F | Ht 62.75 in | Wt 150.1 lb

## 2021-12-09 DIAGNOSIS — F430 Acute stress reaction: Secondary | ICD-10-CM | POA: Diagnosis not present

## 2021-12-09 DIAGNOSIS — K219 Gastro-esophageal reflux disease without esophagitis: Secondary | ICD-10-CM

## 2021-12-09 MED ORDER — FAMOTIDINE 20 MG PO TABS: 20.0000 mg | ORAL_TABLET | Freq: Two times a day (BID) | ORAL | 3 refills | Status: DC

## 2021-12-09 MED ORDER — FLUTICASONE PROPIONATE 50 MCG/ACT NA SUSP
2.0000 | Freq: Every day | NASAL | 3 refills | Status: DC
Start: 2021-12-09 — End: 2023-02-20

## 2021-12-09 MED ORDER — OMEPRAZOLE 20 MG PO CPDR
DELAYED_RELEASE_CAPSULE | ORAL | 1 refills | Status: DC
Start: 2021-12-09 — End: 2022-02-26

## 2021-12-09 NOTE — Patient Instructions (Addendum)
Be careful with onions and tomatoes and cabbage for acid reflux  Keep track of your diet  Continue the omeprazole Add pepcid twice daily for now  This is to control acid If not improving with this in 2 weeks   Continue the effexor  We may consider increasing the dose later depending on how you do  Continue the counseling
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
## 2021-12-09 NOTE — Assessment & Plan Note (Signed)
With anxious/ depressed mood  Since last visit situational stress is a little better (husband doing better) Reviewed stressors/ coping techniques/symptoms/ support sources/ tx options and side effects in detail today She sees counselor and this helps  Taking effexor xr 37.5 daily  Enc her to continue this dose and we may consider inc it later if stress worsens again  Is tolerating well  Strongly encouraged self care and she is doing better with that also

## 2021-12-09 NOTE — Assessment & Plan Note (Signed)
Acid reflux symptoms (heartburn) are worse lately  Poss due to stress and more tomato/onion in diet (also cabbage) Enc her to keep a diet log  Taking omeprazole 40 mg daily  Today added generic pepcid 20 mg bid  Disc diet - handouts given  inst if not much improved in 2 weeks will need to refer to GI

## 2022-01-28 DIAGNOSIS — F331 Major depressive disorder, recurrent, moderate: Secondary | ICD-10-CM | POA: Diagnosis not present

## 2022-02-06 ENCOUNTER — Other Ambulatory Visit: Payer: Self-pay | Admitting: Family Medicine

## 2022-02-20 DIAGNOSIS — F331 Major depressive disorder, recurrent, moderate: Secondary | ICD-10-CM | POA: Diagnosis not present

## 2022-02-26 ENCOUNTER — Other Ambulatory Visit: Payer: Self-pay | Admitting: Family Medicine

## 2022-04-07 DIAGNOSIS — F331 Major depressive disorder, recurrent, moderate: Secondary | ICD-10-CM | POA: Diagnosis not present

## 2022-06-04 ENCOUNTER — Ambulatory Visit (INDEPENDENT_AMBULATORY_CARE_PROVIDER_SITE_OTHER): Payer: Medicare Other | Admitting: Internal Medicine

## 2022-06-04 ENCOUNTER — Encounter: Payer: Self-pay | Admitting: Internal Medicine

## 2022-06-04 VITALS — BP 128/76 | HR 90 | Temp 98.5°F | Ht 63.75 in | Wt 155.0 lb

## 2022-06-04 DIAGNOSIS — J0110 Acute frontal sinusitis, unspecified: Secondary | ICD-10-CM | POA: Diagnosis not present

## 2022-06-04 MED ORDER — BENZONATATE 200 MG PO CAPS: 200.0000 mg | ORAL_CAPSULE | Freq: Three times a day (TID) | ORAL | 0 refills | Status: DC | PRN

## 2022-06-04 MED ORDER — AMOXICILLIN-POT CLAVULANATE 875-125 MG PO TABS: 1.0000 | ORAL_TABLET | Freq: Two times a day (BID) | ORAL | 0 refills | Status: DC

## 2022-06-04 NOTE — Assessment & Plan Note (Signed)
Sick for a week and not improving Analgesics--tylenol Will Rx augmentin 875 bid x 7 days Benzonatate for prn cough

## 2022-06-04 NOTE — Progress Notes (Signed)
Subjective:    Patient ID: Sue Conway, female    DOB: [birth year], 77 y.o.   MRN: [id number]  HPI Here due to cough  Coughing for near a week Thought it would get better--but hasn't  Keeping her up at night No fever--but getting hot/sweats Lots of phlegm--green at times No headache Some sore throat Chronic ear problems---tinnitus and pounding sound at times Not clear if post nasal drip No SOB  Tested for COVID at first---negative  Used aerborne, different cough drops--slight help  Current Outpatient Medications on File Prior to Visit  Medication Sig Dispense Refill   amLODipine (NORVASC) 5 MG tablet TAKE 1 TABLET BY MOUTH DAILY 90 tablet 1   Cetirizine HCl (ZYRTEC PO) Take 1 tablet by mouth as needed. Reported on 11/08/2015     famotidine (PEPCID) 20 MG tablet Take 1 tablet (20 mg total) by mouth 2 (two) times daily. 180 tablet 3   fluticasone (FLONASE) 50 MCG/ACT nasal spray Place 2 sprays into both nostrils daily. Reported on 11/08/2015 48 g 3   Multiple Vitamin (MULTIVITAMIN) tablet Take 1 tablet by mouth daily. Centrum silver daily     omeprazole (PRILOSEC) 20 MG capsule TAKE 1 CAPSULE BY MOUTH EVERY DAY 90 capsule 1   venlafaxine XR (EFFEXOR-XR) 37.5 MG 24 hr capsule TAKE 1 CAPSULE BY MOUTH DAILY WITH BREAKFAST. 90 capsule 2   vitamin B-12 (CYANOCOBALAMIN) 1000 MCG tablet Take 1,000 mcg by mouth daily.     No current facility-administered medications on file prior to visit.    Allergies  Allergen Reactions   Aspirin     REACTION: worsens her tinnitis   Atorvastatin     Leg muscle pain    Prozac [Fluoxetine Hcl]    Zoloft [Sertraline Hcl]     Past Medical History:  Diagnosis Date   Allergy    Anemia    slight- no tx as of yet    Cataract    removed bilat with lens to left eye    Chest pain    Diverticulosis of colon    BE mild diverticulosis 01/1995   Elevated blood sugar level    Glaucoma 09/24/2005   history of glaucoma (Dr. Darolyn Rua)    Hyperlipidemia    Hypertension    eye drops elevated BP- not discontined since per pt.   Osteopenia    Ringing in ears    worse with aspirin   Tinnitus     Past Surgical History:  Procedure Laterality Date   ABDOMINAL HYSTERECTOMY  1999   total hysterectomy fibroids   BREAST BIOPSY N/A 2013   needle,benign   CATARACT EXTRACTION, BILATERAL     CEREBRAL ANEURYSM REPAIR  02/2003   COLONOSCOPY     FINE NEEDLE ASPIRATION  06/2010   breast   OOPHORECTOMY      Family History  Problem Relation Age of Onset   Diabetes Mother    Hypertension Mother    Heart disease Mother        CHF   Diabetes Father    Diabetes Brother    Heart disease Brother 32       CVA and MI   Breast cancer Neg Hx    Colon cancer Neg Hx    Colon polyps Neg Hx    Esophageal cancer Neg Hx    Rectal cancer Neg Hx    Stomach cancer Neg Hx     Social History   Socioeconomic History   Marital status: Married  Spouse name: Not on file   Number of children: Not on file   Years of education: Not on file   Highest education level: Not on file  Occupational History   Not on file  Tobacco Use   Smoking status: Never    Passive exposure: Past   Smokeless tobacco: Never  Vaping Use   Vaping Use: Never used  Substance and Sexual Activity   Alcohol use: No    Alcohol/week: 0.0 standard drinks of alcohol   Drug use: No   Sexual activity: Never  Other Topics Concern   Not on file  Social History Narrative   Not on file   Social Determinants of Health   Financial Resource Strain: Low Risk  (09/19/2021)   Overall Financial Resource Strain (CARDIA)    Difficulty of Paying Living Expenses: Not hard at all  Food Insecurity: No Food Insecurity (09/19/2021)   Hunger Vital Sign    Worried About Running Out of Food in the Last Year: Never true    [place] in the Last Year: Never true  Transportation Needs: No Transportation Needs (09/19/2021)   PRAPARE - Hydrologist  (Medical): No    Lack of Transportation (Non-Medical): No  Physical Activity: Insufficiently Active (09/19/2021)   Exercise Vital Sign    Days of Exercise per Week: 3 days    Minutes of Exercise per Session: 30 min  Stress: No Stress Concern Present (09/19/2021)   [place]    Feeling of Stress : Not at all  Social Connections: [place] (09/19/2021)   Social Connection and Isolation Panel [NHANES]    Frequency of Communication with Friends and Family: Twice a week    Frequency of Social Gatherings with Friends and Family: Twice a week    Attends Religious Services: More than 4 times per year    Active Member of [employer] or Organizations: Yes    Attends Music therapist: More than 4 times per year    Marital Status: Married  Human resources officer Violence: Not At Risk (09/19/2021)   Humiliation, Afraid, Rape, and Kick questionnaire    Fear of Current or Ex-Partner: No    Emotionally Abused: No    Physically Abused: No    Sexually Abused: No   Review of Systems Some change in smell and taste No N/V Able to eat okay     Objective:   Physical Exam Constitutional:      Appearance: Normal appearance.  HENT:     Head:     Comments: Mild frontal tenderness    Right Ear: Tympanic membrane and ear canal normal.     Left Ear: Tympanic membrane and ear canal normal.     Nose: Congestion present.     Mouth/Throat:     Pharynx: No oropharyngeal exudate or posterior oropharyngeal erythema.  Pulmonary:     Effort: Pulmonary effort is normal.     Breath sounds: Normal breath sounds. No wheezing or rales.  Musculoskeletal:     Cervical back: Neck supple.  Lymphadenopathy:     Cervical: No cervical adenopathy.  Neurological:     Mental Status: She is alert.            Assessment & Plan:

## 2022-06-27 ENCOUNTER — Telehealth: Payer: Self-pay | Admitting: Family Medicine

## 2022-06-27 NOTE — Telephone Encounter (Signed)
Dr. Silvio Pate is out of the office will route to PCP

## 2022-06-27 NOTE — Telephone Encounter (Signed)
Pt notified of Dr. Marliss Coots recommendations and verbalized understanding, ER precautions given

## 2022-06-27 NOTE — Telephone Encounter (Signed)
There are few to no px cough meds in stock right now  I recommend delsym otc  Follow up if not improved or if worse  Please give ER precautions

## 2022-06-27 NOTE — Telephone Encounter (Signed)
Patient was seen on 06/04/2022,for a ongoing cough with Dr Silvio Pate. She was prescribedbenzonatate (TESSALON) 200 MG capsule  ,but she said they aren't really working. Her coughing spells haven't been as bad,but she is still having them. She would like to know if there is anything else that can be called in to help her cough?

## 2022-07-01 ENCOUNTER — Telehealth: Payer: Self-pay | Admitting: Family Medicine

## 2022-07-01 DIAGNOSIS — E7800 Pure hypercholesterolemia, unspecified: Secondary | ICD-10-CM

## 2022-07-01 DIAGNOSIS — I10 Essential (primary) hypertension: Secondary | ICD-10-CM

## 2022-07-01 DIAGNOSIS — R7303 Prediabetes: Secondary | ICD-10-CM

## 2022-07-01 NOTE — Telephone Encounter (Signed)
-----   Message from Ellamae Sia sent at 06/30/2022  2:59 PM EST ----- Regarding: Lab orders for Wednesday, 2.14.24 Patient is scheduled for CPX labs, please order future labs, Thanks , Karna Christmas

## 2022-07-02 ENCOUNTER — Other Ambulatory Visit (INDEPENDENT_AMBULATORY_CARE_PROVIDER_SITE_OTHER): Payer: Medicare Other

## 2022-07-02 DIAGNOSIS — I10 Essential (primary) hypertension: Secondary | ICD-10-CM | POA: Diagnosis not present

## 2022-07-02 DIAGNOSIS — R7303 Prediabetes: Secondary | ICD-10-CM | POA: Diagnosis not present

## 2022-07-02 DIAGNOSIS — E7800 Pure hypercholesterolemia, unspecified: Secondary | ICD-10-CM | POA: Diagnosis not present

## 2022-07-02 LAB — CBC WITH DIFFERENTIAL/PLATELET
Basophils Absolute: 0 10*3/uL (ref 0.0–0.1)
Basophils Relative: 0.8 % (ref 0.0–3.0)
Eosinophils Absolute: 0.2 10*3/uL (ref 0.0–0.7)
Eosinophils Relative: 3 % (ref 0.0–5.0)
HCT: 35.7 % — ABNORMAL LOW (ref 36.0–46.0)
Hemoglobin: 11.6 g/dL — ABNORMAL LOW (ref 12.0–15.0)
Lymphocytes Relative: 35 % (ref 12.0–46.0)
Lymphs Abs: 2 10*3/uL (ref 0.7–4.0)
MCHC: 32.5 g/dL (ref 30.0–36.0)
MCV: 82.4 fl (ref 78.0–100.0)
Monocytes Absolute: 0.5 10*3/uL (ref 0.1–1.0)
Monocytes Relative: 8 % (ref 3.0–12.0)
Neutro Abs: 3.1 10*3/uL (ref 1.4–7.7)
Neutrophils Relative %: 53.2 % (ref 43.0–77.0)
Platelets: 294 10*3/uL (ref 150.0–400.0)
RBC: 4.33 Mil/uL (ref 3.87–5.11)
RDW: 15.6 % — ABNORMAL HIGH (ref 11.5–15.5)
WBC: 5.8 10*3/uL (ref 4.0–10.5)

## 2022-07-02 LAB — LIPID PANEL
Cholesterol: 208 mg/dL — ABNORMAL HIGH (ref 0–200)
HDL: 64.1 mg/dL (ref >39.00)
LDL Cholesterol: 127 mg/dL — ABNORMAL HIGH (ref 0–99)
NonHDL: 144.23
Total CHOL/HDL Ratio: 3
Triglycerides: 88 mg/dL (ref 0.0–149.0)
VLDL: 17.6 mg/dL (ref 0.0–40.0)

## 2022-07-02 LAB — COMPREHENSIVE METABOLIC PANEL
ALT: 21 U/L (ref 0–35)
AST: 21 U/L (ref 0–37)
Albumin: 4.1 g/dL (ref 3.5–5.2)
Alkaline Phosphatase: 85 U/L (ref 39–117)
BUN: 17 mg/dL (ref 6–23)
CO2: 30 mEq/L (ref 19–32)
Calcium: 9.4 mg/dL (ref 8.4–10.5)
Chloride: 104 mEq/L (ref 96–112)
Creatinine, Ser: 0.87 mg/dL (ref 0.40–1.20)
GFR: 63.73 mL/min (ref >60.00)
Glucose, Bld: 92 mg/dL (ref 70–99)
Potassium: 4.2 mEq/L (ref 3.5–5.1)
Sodium: 141 mEq/L (ref 135–145)
Total Bilirubin: 0.3 mg/dL (ref 0.2–1.2)
Total Protein: 6.9 g/dL (ref 6.0–8.3)

## 2022-07-02 LAB — HEMOGLOBIN A1C: Hgb A1c MFr Bld: 6 % (ref 4.6–6.5)

## 2022-07-02 LAB — TSH: TSH: 1.76 u[IU]/mL (ref 0.35–5.50)

## 2022-07-09 ENCOUNTER — Ambulatory Visit (INDEPENDENT_AMBULATORY_CARE_PROVIDER_SITE_OTHER): Payer: Medicare Other | Admitting: Family Medicine

## 2022-07-09 ENCOUNTER — Encounter: Payer: Self-pay | Admitting: Family Medicine

## 2022-07-09 VITALS — BP 138/78 | HR 85 | Temp 97.6°F | Ht 63.0 in | Wt 154.4 lb

## 2022-07-09 DIAGNOSIS — R7303 Prediabetes: Secondary | ICD-10-CM

## 2022-07-09 DIAGNOSIS — Z0000 Encounter for general adult medical examination without abnormal findings: Secondary | ICD-10-CM

## 2022-07-09 DIAGNOSIS — M8588 Other specified disorders of bone density and structure, other site: Secondary | ICD-10-CM | POA: Diagnosis not present

## 2022-07-09 DIAGNOSIS — Z636 Dependent relative needing care at home: Secondary | ICD-10-CM

## 2022-07-09 DIAGNOSIS — E2839 Other primary ovarian failure: Secondary | ICD-10-CM

## 2022-07-09 DIAGNOSIS — K219 Gastro-esophageal reflux disease without esophagitis: Secondary | ICD-10-CM

## 2022-07-09 DIAGNOSIS — E7800 Pure hypercholesterolemia, unspecified: Secondary | ICD-10-CM

## 2022-07-09 DIAGNOSIS — I10 Essential (primary) hypertension: Secondary | ICD-10-CM

## 2022-07-09 DIAGNOSIS — Z1211 Encounter for screening for malignant neoplasm of colon: Secondary | ICD-10-CM

## 2022-07-09 DIAGNOSIS — R052 Subacute cough: Secondary | ICD-10-CM

## 2022-07-09 DIAGNOSIS — Z1231 Encounter for screening mammogram for malignant neoplasm of breast: Secondary | ICD-10-CM

## 2022-07-09 NOTE — Assessment & Plan Note (Signed)
Dexa due in April No falls or fx Enc her to get back on vit D  She will call in April to req dexa order

## 2022-07-09 NOTE — Assessment & Plan Note (Signed)
Better control with omeprazole and pepcid  Has a cough however (also on ace) Will plan future cxr and if nl consider change ace

## 2022-07-09 NOTE — Assessment & Plan Note (Signed)
Lab Results  Component Value Date   HGBA1C 6.0 07/02/2022   disc imp of low glycemic diet and wt loss to prevent DM2  This is stable

## 2022-07-09 NOTE — Patient Instructions (Addendum)
Let's get a chest xray next week   Then I may change the lisinopril - it can cause a cough in the setting of acid reflux   Call [facility] Ask them if you have had the shingrix vaccine  If so- ask them to send [place] a copy  If not- check on price and see if you can get it   I think you are due for a tetanus shot  Check with the pharmacy on price of that also    Call [facility] and schedule your mammogram  I put the order in   Your bone density test is due after April  Call us then to put in the order so you can schedule   Try to get 1200-1500 mg of calcium per day with at least 2000 iu of vitamin D - for bone health If calcium makes you constipated then just take vitamin D3   2000 iu daily   Keep up the walking  Strength training is very important , weights or exercise bands are great    For cholesterol Avoid red meat/ fried foods/ egg yolks/ fatty breakfast meats/ butter, cheese and high fat dairy/ and shellfish   Let's re check cholesterol in 3 months after watching your diet   To prevent diabetes Try to get most of your carbohydrates from produce (with the exception of white potatoes)  Eat less bread/pasta/rice/snack foods/cereals/sweets and other items from the middle of the grocery store (processed carbs)

## 2022-07-09 NOTE — Assessment & Plan Note (Signed)
Reviewed health habits including diet and exercise and skin cancer prevention Reviewed appropriate screening tests for age  Also reviewed health mt list, fam hx and immunization status , as well as social and family history   See HPI Labs reviewed Pt will check with pharmacy to see if she has had shingrix vaccine  Also consider tetanus shot if affordable  Mammogram ordered-pt can call to schedule Dexa will be due after April for osteopenia, no falls or fx  Enc more exercise  Colonoscopy 2019 with no recall  Stable mood/ PHQ of 1 Stressors are a bit improved

## 2022-07-09 NOTE — Assessment & Plan Note (Signed)
Mammogram ordered Pt has # to schedule

## 2022-07-09 NOTE — Assessment & Plan Note (Signed)
bp in fair control at this time  BP Readings from Last 1 Encounters:  07/09/22 138/78   This is controlled but has developed a cough - ? Due to ace in setting of GERD Most recent labs reviewed  Disc lifstyle change with low sodium diet and exercise   Will check cxr upcoming and if nl will likely change ace to arb

## 2022-07-09 NOTE — Assessment & Plan Note (Signed)
?   If from ace in setting of gerd   Will plan cxr next week If nl change ace to arb
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
## 2022-07-09 NOTE — Assessment & Plan Note (Signed)
Colonoscopy 2019 with no recall

## 2022-07-09 NOTE — Assessment & Plan Note (Signed)
Things are going a bit better at home  Doing some counseling  Husband's mood has been more stable

## 2022-08-03 ENCOUNTER — Other Ambulatory Visit: Payer: Self-pay | Admitting: Family Medicine

## 2022-08-04 ENCOUNTER — Telehealth: Payer: Self-pay | Admitting: Family Medicine

## 2022-08-04 ENCOUNTER — Other Ambulatory Visit: Payer: Self-pay | Admitting: Family Medicine

## 2022-08-04 NOTE — Telephone Encounter (Signed)
Pt called stated she receive a message to call back regarding lab results was not sure if it was ment for her . Please advise [phone]

## 2022-08-04 NOTE — Telephone Encounter (Signed)
I didn't call pt and she hasn't had any recent labs

## 2022-08-06 ENCOUNTER — Ambulatory Visit
Admission: RE | Admit: 2022-08-06 | Discharge: 2022-08-06 | Disposition: A | Discharge status: Home / Self Care | Payer: Medicare Other | Source: Ambulatory Visit | Attending: Family Medicine | Admitting: Family Medicine

## 2022-08-06 DIAGNOSIS — Z1231 Encounter for screening mammogram for malignant neoplasm of breast: Secondary | ICD-10-CM | POA: Diagnosis not present

## 2022-08-23 ENCOUNTER — Other Ambulatory Visit: Payer: Self-pay | Admitting: Family Medicine

## 2022-09-01 DIAGNOSIS — K080 Exfoliation of teeth due to systemic causes: Secondary | ICD-10-CM | POA: Diagnosis not present

## 2022-09-11 DIAGNOSIS — K080 Exfoliation of teeth due to systemic causes: Secondary | ICD-10-CM | POA: Diagnosis not present

## 2022-09-12 DIAGNOSIS — Z9842 Cataract extraction status, left eye: Secondary | ICD-10-CM | POA: Diagnosis not present

## 2022-09-12 DIAGNOSIS — Z9841 Cataract extraction status, right eye: Secondary | ICD-10-CM | POA: Diagnosis not present

## 2022-09-12 DIAGNOSIS — H5213 Myopia, bilateral: Secondary | ICD-10-CM | POA: Diagnosis not present

## 2022-10-06 ENCOUNTER — Telehealth: Payer: Self-pay | Admitting: Family Medicine

## 2022-10-06 DIAGNOSIS — E7800 Pure hypercholesterolemia, unspecified: Secondary | ICD-10-CM

## 2022-10-06 NOTE — Telephone Encounter (Signed)
-----   Message from Alvina Chou sent at 09/29/2022 10:54 AM EDT ----- Regarding: Lab orders for Tuesday, 5.21.24 Lab orders for a 3 month follow up appt.

## 2022-10-07 ENCOUNTER — Other Ambulatory Visit (INDEPENDENT_AMBULATORY_CARE_PROVIDER_SITE_OTHER): Payer: Medicare Other

## 2022-10-07 DIAGNOSIS — E7800 Pure hypercholesterolemia, unspecified: Secondary | ICD-10-CM | POA: Diagnosis not present

## 2022-10-07 LAB — LIPID PANEL
Cholesterol: 211 mg/dL — ABNORMAL HIGH (ref 0–200)
HDL: 72.6 mg/dL (ref >39.00)
LDL Cholesterol: 122 mg/dL — ABNORMAL HIGH (ref 0–99)
NonHDL: 138.03
Total CHOL/HDL Ratio: 3
Triglycerides: 80 mg/dL (ref 0.0–149.0)
VLDL: 16 mg/dL (ref 0.0–40.0)

## 2022-10-10 ENCOUNTER — Telehealth: Payer: Self-pay | Admitting: Family Medicine

## 2022-10-10 ENCOUNTER — Other Ambulatory Visit: Payer: Self-pay

## 2022-10-10 DIAGNOSIS — E7800 Pure hypercholesterolemia, unspecified: Secondary | ICD-10-CM

## 2022-10-10 MED ORDER — EZETIMIBE 10 MG PO TABS: 10.0000 mg | ORAL_TABLET | Freq: Every day | ORAL | 1 refills | Status: DC

## 2022-10-10 NOTE — Telephone Encounter (Signed)
Patient called in returning a call she received. Thank you! 

## 2022-10-10 NOTE — Telephone Encounter (Signed)
See lab results for documentation.  

## 2022-11-25 ENCOUNTER — Ambulatory Visit (INDEPENDENT_AMBULATORY_CARE_PROVIDER_SITE_OTHER): Payer: Medicare Other

## 2022-11-25 VITALS — Ht 63.0 in | Wt 157.0 lb

## 2022-11-25 DIAGNOSIS — Z0000 Encounter for general adult medical examination without abnormal findings: Secondary | ICD-10-CM | POA: Diagnosis not present

## 2022-11-25 DIAGNOSIS — Z780 Asymptomatic menopausal state: Secondary | ICD-10-CM

## 2022-11-25 NOTE — Patient Instructions (Signed)
Sue Conway , Thank you for taking time to come for your Medicare Wellness Visit. I appreciate your ongoing commitment to your health goals. Please review the following plan we discussed and let me know if I can assist you in the future.   These are the goals we discussed:  Goals      Patient Stated     Starting 05/05/2018, I will continue to take medications as prescribed.      Patient Stated     05/18/2019, I will try to go to the gym and increase my physical activity.      Patient Stated     Continue current lifestyle      Patient Stated     Exercise more.        This is a list of the screening recommended for you and due dates:  Health Maintenance  Topic Date Due   Zoster (Shingles) Vaccine (1 of 2) Never done   DTaP/Tdap/Td vaccine (2 - Tdap) 12/18/2011   COVID-19 Vaccine (4 - 2023-24 season) 01/17/2022   Medicare Annual Wellness Visit  09/20/2022   Flu Shot  12/18/2022   Mammogram  08/06/2023   Pneumonia Vaccine  Completed   DEXA scan (bone density measurement)  Completed   Hepatitis C Screening  Completed   HPV Vaccine  Aged Out   Colon Cancer Screening  Discontinued    Advanced directives: Advance directive discussed with you today. Even though you declined this today, please call our office should you change your mind, and we can give you the proper paperwork for you to fill out.   Conditions/risks identified: Aim for 30 minutes of exercise or brisk walking, 6-8 glasses of water, and 5 servings of fruits and vegetables each day.   Next appointment: Follow up in one year for your annual wellness visit 11/26/23 @ 2:30 telephone call   Preventive Care 65 Years and Older, Female Preventive care refers to lifestyle choices and visits with your health care provider that can promote health and wellness. What does preventive care include? A yearly physical exam. This is also called an annual well check. Dental exams once or twice a year. Routine eye exams. Ask your  health care provider how often you should have your eyes checked. Personal lifestyle choices, including: Daily care of your teeth and gums. Regular physical activity. Eating a healthy diet. Avoiding tobacco and drug use. Limiting alcohol use. Practicing safe sex. Taking low-dose aspirin every day. Taking vitamin and mineral supplements as recommended by your health care provider. What happens during an annual well check? The services and screenings done by your health care provider during your annual well check will depend on your age, overall health, lifestyle risk factors, and family history of disease. Counseling  Your health care provider may ask you questions about your: Alcohol use. Tobacco use. Drug use. Emotional well-being. Home and relationship well-being. Sexual activity. Eating habits. History of falls. Memory and ability to understand (cognition). Work and work Astronomer. Reproductive health. Screening  You may have the following tests or measurements: Height, weight, and BMI. Blood pressure. Lipid and cholesterol levels. These may be checked every 5 years, or more frequently if you are over 11 years old. Skin check. Lung cancer screening. You may have this screening every year starting at age 58 if you have a 30-pack-year history of smoking and currently smoke or have quit within the past 15 years. Fecal occult blood test (FOBT) of the stool. You may have this test every  year starting at age 84. Flexible sigmoidoscopy or colonoscopy. You may have a sigmoidoscopy every 5 years or a colonoscopy every 10 years starting at age 77. Hepatitis C blood test. Hepatitis B blood test. Sexually transmitted disease (STD) testing. Diabetes screening. This is done by checking your blood sugar (glucose) after you have not eaten for a while (fasting). You may have this done every 1-3 years. Bone density scan. This is done to screen for osteoporosis. You may have this done  starting at age 58. Mammogram. This may be done every 1-2 years. Talk to your health care provider about how often you should have regular mammograms. Talk with your health care provider about your test results, treatment options, and if necessary, the need for more tests. Vaccines  Your health care provider may recommend certain vaccines, such as: Influenza vaccine. This is recommended every year. Tetanus, diphtheria, and acellular pertussis (Tdap, Td) vaccine. You may need a Td booster every 10 years. Zoster vaccine. You may need this after age 69. Pneumococcal 13-valent conjugate (PCV13) vaccine. One dose is recommended after age 21. Pneumococcal polysaccharide (PPSV23) vaccine. One dose is recommended after age 21. Talk to your health care provider about which screenings and vaccines you need and how often you need them. This information is not intended to replace advice given to you by your health care provider. Make sure you discuss any questions you have with your health care provider. Document Released: 06/01/2015 Document Revised: 01/23/2016 Document Reviewed: 03/06/2015 Elsevier Interactive Patient Education  2017 [employer].  Fall Prevention in the Home Falls can cause injuries. They can happen to people of all ages. There are many things you can do to make your home safe and to help prevent falls. What can I do on the outside of my home? Regularly fix the edges of walkways and driveways and fix any cracks. Remove anything that might make you trip as you walk through a door, such as a raised step or threshold. Trim any bushes or trees on the path to your home. Use bright outdoor lighting. Clear any walking paths of anything that might make someone trip, such as rocks or tools. Regularly check to see if handrails are loose or broken. Make sure that both sides of any steps have handrails. Any raised decks and porches should have guardrails on the edges. Have any leaves, snow, or  ice cleared regularly. Use sand or salt on walking paths during winter. Clean up any spills in your garage right away. This includes oil or grease spills. What can I do in the bathroom? Use night lights. Install grab bars by the toilet and in the tub and shower. Do not use towel bars as grab bars. Use non-skid mats or decals in the tub or shower. If you need to sit down in the shower, use a plastic, non-slip stool. Keep the floor dry. Clean up any water that spills on the floor as soon as it happens. Remove soap buildup in the tub or shower regularly. Attach bath mats securely with double-sided non-slip rug tape. Do not have throw rugs and other things on the floor that can make you trip. What can I do in the bedroom? Use night lights. Make sure that you have a light by your bed that is easy to reach. Do not use any sheets or blankets that are too big for your bed. They should not hang down onto the floor. Have a firm chair that has side arms. You can use this  for support while you get dressed. Do not have throw rugs and other things on the floor that can make you trip. What can I do in the kitchen? Clean up any spills right away. Avoid walking on wet floors. Keep items that you use a lot in easy-to-reach places. If you need to reach something above you, use a strong step stool that has a grab bar. Keep electrical cords out of the way. Do not use floor polish or wax that makes floors slippery. If you must use wax, use non-skid floor wax. Do not have throw rugs and other things on the floor that can make you trip. What can I do with my stairs? Do not leave any items on the stairs. Make sure that there are handrails on both sides of the stairs and use them. Fix handrails that are broken or loose. Make sure that handrails are as long as the stairways. Check any carpeting to make sure that it is firmly attached to the stairs. Fix any carpet that is loose or worn. Avoid having throw rugs at  the top or bottom of the stairs. If you do have throw rugs, attach them to the floor with carpet tape. Make sure that you have a light switch at the top of the stairs and the bottom of the stairs. If you do not have them, ask someone to add them for you. What else can I do to help prevent falls? Wear shoes that: Do not have high heels. Have rubber bottoms. Are comfortable and fit you well. Are closed at the toe. Do not wear sandals. If you use a stepladder: Make sure that it is fully opened. Do not climb a closed stepladder. Make sure that both sides of the stepladder are locked into place. Ask someone to hold it for you, if possible. Clearly mark and make sure that you can see: Any grab bars or handrails. First and last steps. Where the edge of each step is. Use tools that help you move around (mobility aids) if they are needed. These include: Canes. Walkers. Scooters. Crutches. Turn on the lights when you go into a dark area. Replace any light bulbs as soon as they burn out. Set up your furniture so you have a clear path. Avoid moving your furniture around. If any of your floors are uneven, fix them. If there are any pets around you, be aware of where they are. Review your medicines with your doctor. Some medicines can make you feel dizzy. This can increase your chance of falling. Ask your doctor what other things that you can do to help prevent falls. This information is not intended to replace advice given to you by your health care provider. Make sure you discuss any questions you have with your health care provider. Document Released: 03/01/2009 Document Revised: 10/11/2015 Document Reviewed: 06/09/2014 Elsevier Interactive Patient Education  2017 [employer].

## 2022-11-25 NOTE — Progress Notes (Signed)
Subjective:   Sue Conway is a 77 y.o. female who presents for Medicare Annual (Subsequent) preventive examination.  Visit Complete: Virtual  I connected with  Sue Conway on 11/25/22 by a audio enabled telemedicine application and verified that I am speaking with the correct person using two identifiers.  Patient Location: Home  Provider Location: Office/Clinic  I discussed the limitations of evaluation and management by telemedicine. The patient expressed understanding and agreed to proceed.  Review of Systems      Cardiac Risk Factors include: advanced age (>25men, >92 women);hypertension;sedentary lifestyle     Objective:    Today's Vitals   11/25/22 1536  Weight: 157 lb (71.2 kg)  Height: 5\' 3"  (1.6 m)   Body mass index is 27.81 kg/m.     11/25/2022    3:43 PM 09/19/2021   10:01 AM 11/08/2019    7:58 PM 05/18/2019    9:03 AM 05/05/2018    9:02 AM 11/08/2015   11:16 AM 04/11/2015    2:23 PM  Advanced Directives  Does Patient Have a Medical Advance Directive? No No No No No No No  Would patient like information on creating a medical advance directive? No - Patient declined No - Patient declined No - Patient declined No - Patient declined No - Patient declined No - patient declined information No - patient declined information    Current Medications (verified) Outpatient Encounter Medications as of 11/25/2022  Medication Sig   amLODipine (NORVASC) 5 MG tablet TAKE 1 TABLET BY MOUTH DAILY   Cetirizine HCl (ZYRTEC PO) Take 1 tablet by mouth as needed. Reported on 11/08/2015   ezetimibe (ZETIA) 10 MG tablet Take 1 tablet (10 mg total) by mouth daily.   famotidine (PEPCID) 20 MG tablet Take 1 tablet (20 mg total) by mouth 2 (two) times daily.   fluticasone (FLONASE) 50 MCG/ACT nasal spray Place 2 sprays into both nostrils daily. Reported on 11/08/2015   Multiple Vitamin (MULTIVITAMIN) tablet Take 1 tablet by mouth daily. Centrum silver daily   omeprazole (PRILOSEC) 20 MG  capsule TAKE 1 CAPSULE BY MOUTH EVERY DAY   venlafaxine XR (EFFEXOR-XR) 37.5 MG 24 hr capsule TAKE 1 CAPSULE BY MOUTH DAILY WITH BREAKFAST.   vitamin B-12 (CYANOCOBALAMIN) 1000 MCG tablet Take 1,000 mcg by mouth daily.   No facility-administered encounter medications on file as of 11/25/2022.    Allergies (verified) Aspirin, Atorvastatin, Prozac [fluoxetine hcl], and Zoloft [sertraline hcl]   History: Past Medical History:  Diagnosis Date   Allergy    Anemia    slight- no tx as of yet    Cataract    removed bilat with lens to left eye    Chest pain    Diverticulosis of colon    BE mild diverticulosis 01/1995   Elevated blood sugar level    Glaucoma 09/24/2005   history of glaucoma (Dr. Ranell Patrick)   Hyperlipidemia    Hypertension    eye drops elevated BP- not discontined since per pt.   Osteopenia    Ringing in ears    worse with aspirin   Tinnitus    Past Surgical History:  Procedure Laterality Date   ABDOMINAL HYSTERECTOMY  1999   total hysterectomy fibroids   BREAST BIOPSY N/A 2013   needle,benign   CATARACT EXTRACTION, BILATERAL     CEREBRAL ANEURYSM REPAIR  02/2003   COLONOSCOPY     FINE NEEDLE ASPIRATION  06/2010   breast   OOPHORECTOMY     Family  History  Problem Relation Age of Onset   Diabetes Mother    Hypertension Mother    Heart disease Mother        CHF   Diabetes Father    Diabetes Brother    Heart disease Brother 72       CVA and MI   Breast cancer Neg Hx    Colon cancer Neg Hx    Colon polyps Neg Hx    Esophageal cancer Neg Hx    Rectal cancer Neg Hx    Stomach cancer Neg Hx    Social History   Socioeconomic History   Marital status: Married    Spouse name: Not on file   Number of children: Not on file   Years of education: Not on file   Highest education level: Not on file  Occupational History   Not on file  Tobacco Use   Smoking status: Never    Passive exposure: Past   Smokeless tobacco: Never  Vaping Use   Vaping Use:  Never used  Substance and Sexual Activity   Alcohol use: No    Alcohol/week: 0.0 standard drinks of alcohol   Drug use: No   Sexual activity: Never  Other Topics Concern   Not on file  Social History Narrative   Not on file   Social Determinants of Health   Financial Resource Strain: Low Risk  (11/25/2022)   Overall Financial Resource Strain (CARDIA)    Difficulty of Paying Living Expenses: Not hard at all  Food Insecurity: No Food Insecurity (11/25/2022)   Hunger Vital Sign    Worried About Running Out of Food in the Last Year: Never true    Ran Out of Food in the Last Year: Never true  Transportation Needs: No Transportation Needs (11/25/2022)   PRAPARE - Administrator, Civil Service (Medical): No    Lack of Transportation (Non-Medical): No  Physical Activity: Insufficiently Active (11/25/2022)   Exercise Vital Sign    Days of Exercise per Week: 2 days    Minutes of Exercise per Session: 20 min  Stress: No Stress Concern Present (11/25/2022)   [employer] of Occupational Health - Occupational Stress Questionnaire    Feeling of Stress : Not at all  Social Connections: Socially Integrated (11/25/2022)   Social Connection and Isolation Panel [NHANES]    Frequency of Communication with Friends and Family: More than three times a week    Frequency of Social Gatherings with Friends and Family: More than three times a week    Attends Religious Services: More than 4 times per year    Active Member of [employer] or Organizations: Yes    Attends Engineer, structural: More than 4 times per year    Marital Status: Married    Tobacco Counseling Counseling given: Not Answered   Clinical Intake:  Pre-visit preparation completed: Yes  Pain : No/denies pain     BMI - recorded: 27.81 Nutritional Status: BMI 25 -29 Overweight Nutritional Risks: None Diabetes: No  How often do you need to have someone help you when you read instructions, pamphlets, or other written  materials from your doctor or pharmacy?: 1 - Never  Interpreter Needed?: No  Information entered by :: C.Aylla Huffine LPN   Activities of Daily Living    11/25/2022    3:45 PM  In your present state of health, do you have any difficulty performing the following activities:  Hearing? 1  Comment "Roaring" in head on occasion  Vision? 0  Difficulty concentrating or making decisions? 0  Walking or climbing stairs? 0  Dressing or bathing? 0  Doing errands, shopping? 0  Preparing Food and eating ? N  Using the Toilet? N  In the past six months, have you accidently leaked urine? N  Do you have problems with loss of bowel control? N  Managing your Medications? N  Managing your Finances? N  Housekeeping or managing your Housekeeping? N    Patient Care Team: Tower, Audrie Gallus, MD as PCP - [facility], [place] as Referring Physician (Optometry)  Indicate any recent Medical Services you may have received from other than [facility] providers in the past year (date may be approximate).     Assessment:   This is a routine wellness examination for Priddy.  Hearing/Vision screen Hearing Screening - Comments:: Hs a &quot;roaring&quot; in head off and on. Vision Screening - Comments:: Readers - [facility] - UTD on eye exams  Dietary issues and exercise activities discussed:     Goals Addressed             This Visit's Progress    Patient Stated       Exercise more.       Depression Screen    11/25/2022    3:42 PM 07/09/2022   11:10 AM 06/04/2022    7:55 AM 11/06/2021    2:54 PM 09/19/2021   10:11 AM 08/23/2020   10:25 AM 05/18/2019    9:07 AM  PHQ 2/9 Scores  PHQ - 2 Score 0 4 4 3 2  0 0  PHQ- 9 Score  16 14 13 2   0    Fall Risk    11/25/2022    3:44 PM 07/09/2022   11:09 AM 11/06/2021    2:54 PM 09/19/2021   10:03 AM 08/23/2020   10:25 AM  Fall Risk   Falls in the past year? 0 0 0 0 1  Number falls in past yr: 0 0  0 0  Injury with Fall? 0 0  0 0  Risk for fall due to :  No Fall Risks No Fall Risks     Follow up Falls prevention discussed;Falls evaluation completed Falls evaluation completed Falls evaluation completed Falls evaluation completed;Education provided;Falls prevention discussed Falls evaluation completed    MEDICARE RISK AT HOME:  Medicare Risk at Home - 11/25/22 1546     Any stairs in or around the home? No    If so, are there any without handrails? No    Home free of loose throw rugs in walkways, pet beds, electrical cords, etc? Yes    Adequate lighting in your home to reduce risk of falls? Yes    Life alert? No    Use of a cane, walker or w/c? No    Grab bars in the bathroom? No    Shower chair or bench in shower? No    Elevated toilet seat or a handicapped toilet? No             TIMED UP AND GO:  Was the test performed?  No    Cognitive Function:    05/18/2019    9:09 AM 05/05/2018    9:02 AM 11/08/2015   11:38 AM  MMSE - Mini Mental State Exam  Orientation to time 5 5 5   Orientation to Place 5 5 5   Registration 3 3 3   Attention/ Calculation 5 0 0  Recall 3 3 3   Language- name 2 objects  0  0  Language- repeat 1 1 1   Language- follow 3 step command  3 3  Language- read & follow direction  0 0  Write a sentence  0 0  Copy design  0 0  Total score  20 20        11/25/2022    3:46 PM 09/19/2021   10:04 AM  6CIT Screen  What Year? 0 points 0 points  What month? 0 points 0 points  What time? 0 points 0 points  Count back from 20 0 points 0 points  Months in reverse 0 points 0 points  Repeat phrase 6 points 0 points  Total Score 6 points 0 points    Immunizations Immunization History  Administered Date(s) Administered   Fluad Quad(high Dose 65+) 03/02/2019, 03/07/2020   Influenza Inj Mdck Quad Pf 03/02/2019   Influenza, High Dose Seasonal PF 01/24/2016, 02/05/2017, 02/17/2018, 01/03/2022   Influenza,inj,Quad PF,6+ Mos 03/03/2014, 04/18/2015   PFIZER(Purple Top)SARS-COV-2 Vaccination 07/14/2019, 08/09/2019,  02/22/2020   Pneumococcal Conjugate-13 06/06/2014   Pneumococcal Polysaccharide-23 09/27/2010   Td 12/17/2001   Zoster, Live 01/24/2016    TDAP status: Due, Education has been provided regarding the importance of this vaccine. Advised may receive this vaccine at local pharmacy or Health Dept. Aware to provide a copy of the vaccination record if obtained from local pharmacy or Health Dept. Verbalized acceptance and understanding.  Flu Vaccine status: Up to date  Pneumococcal vaccine status: Up to date  Covid-19 vaccine status: Information provided on how to obtain vaccines.   Qualifies for Shingles Vaccine? Yes   Zostavax completed Yes   Shingrix Completed?: No.    Education has been provided regarding the importance of this vaccine. Patient has been advised to call insurance company to determine out of pocket expense if they have not yet received this vaccine. Advised may also receive vaccine at local pharmacy or Health Dept. Verbalized acceptance and understanding.  Screening Tests Health Maintenance  Topic Date Due   Zoster Vaccines- Shingrix (1 of 2) Never done   DTaP/Tdap/Td (2 - Tdap) 12/18/2011   COVID-19 Vaccine (4 - 2023-24 season) 01/17/2022   INFLUENZA VACCINE  12/18/2022   MAMMOGRAM  08/06/2023   Medicare Annual Wellness (AWV)  11/25/2023   Pneumonia Vaccine 18+ Years old  Completed   DEXA SCAN  Completed   Hepatitis C Screening  Completed   HPV VACCINES  Aged Out   Colonoscopy  Discontinued    Health Maintenance  Health Maintenance Due  Topic Date Due   Zoster Vaccines- Shingrix (1 of 2) Never done   DTaP/Tdap/Td (2 - Tdap) 12/18/2011   COVID-19 Vaccine (4 - 2023-24 season) 01/17/2022    Colorectal cancer screening: No longer required.   Mammogram status: Completed 08/06/22. Repeat every year  Bone Density status: Ordered 08/31/22. Pt provided with contact info and advised to call to schedule appt.  Lung Cancer Screening: (Low Dose CT Chest recommended if  Age 15-80 years, 20 pack-year currently smoking OR have quit w/in 15years.) does not qualify.   Lung Cancer Screening Referral: n/a  Additional Screening:  Hepatitis C Screening: does qualify; Completed 11/08/15  Vision Screening: Recommended annual ophthalmology exams for early detection of glaucoma and other disorders of the eye. Is the patient up to date with their annual eye exam?  Yes  Who is the provider or what is the name of the office in which the patient attends annual eye exams? [facility] If pt is not established with a provider, would they  like to be referred to a provider to establish care? Yes .   Dental Screening: Recommended annual dental exams for proper oral hygiene    Community Resource Referral / Chronic Care Management: CRR required this visit?  No   CCM required this visit?  No     Plan:     I have personally reviewed and noted the following in the patient's chart:   Medical and social history Use of alcohol, tobacco or illicit drugs  Current medications and supplements including opioid prescriptions. Patient is not currently taking opioid prescriptions. Functional ability and status Nutritional status Physical activity Advanced directives List of other physicians Hospitalizations, surgeries, and ER visits in previous 12 months Vitals Screenings to include cognitive, depression, and falls Referrals and appointments  In addition, I have reviewed and discussed with patient certain preventive protocols, quality metrics, and best practice recommendations. A written personalized care plan for preventive services as well as general preventive health recommendations were provided to patient.     Maryan Puls, LPN   4/0/9811   After Visit Summary: (MyChart) Due to this being a telephonic visit, the after visit summary with patients personalized plan was offered to patient via MyChart   Nurse Notes: Order placed for dexa scan.

## 2022-11-26 ENCOUNTER — Other Ambulatory Visit (INDEPENDENT_AMBULATORY_CARE_PROVIDER_SITE_OTHER): Payer: Medicare Other

## 2022-11-26 DIAGNOSIS — E7800 Pure hypercholesterolemia, unspecified: Secondary | ICD-10-CM

## 2022-11-26 LAB — LIPID PANEL
Cholesterol: 192 mg/dL (ref 0–200)
HDL: 67.1 mg/dL (ref >39.00)
LDL Cholesterol: 105 mg/dL — ABNORMAL HIGH (ref 0–99)
NonHDL: 124.56
Total CHOL/HDL Ratio: 3
Triglycerides: 96 mg/dL (ref 0.0–149.0)
VLDL: 19.2 mg/dL (ref 0.0–40.0)

## 2022-12-18 ENCOUNTER — Other Ambulatory Visit: Payer: Self-pay | Admitting: Family Medicine

## 2023-01-29 ENCOUNTER — Ambulatory Visit
Admission: RE | Admit: 2023-01-29 | Discharge: 2023-01-29 | Disposition: A | Discharge status: Home / Self Care | Payer: Medicare Other | Source: Ambulatory Visit | Attending: Family Medicine | Admitting: Family Medicine

## 2023-01-29 DIAGNOSIS — M8589 Other specified disorders of bone density and structure, multiple sites: Secondary | ICD-10-CM | POA: Diagnosis not present

## 2023-01-29 DIAGNOSIS — Z780 Asymptomatic menopausal state: Secondary | ICD-10-CM | POA: Diagnosis not present

## 2023-01-30 ENCOUNTER — Other Ambulatory Visit: Payer: Self-pay | Admitting: Family Medicine

## 2023-02-04 ENCOUNTER — Other Ambulatory Visit: Payer: Medicare Other

## 2023-02-04 ENCOUNTER — Encounter: Payer: Self-pay | Admitting: *Deleted

## 2023-02-18 ENCOUNTER — Other Ambulatory Visit: Payer: Self-pay | Admitting: Family Medicine

## 2023-02-20 ENCOUNTER — Other Ambulatory Visit: Payer: Self-pay | Admitting: Family Medicine

## 2023-03-17 DIAGNOSIS — K080 Exfoliation of teeth due to systemic causes: Secondary | ICD-10-CM | POA: Diagnosis not present

## 2023-03-27 ENCOUNTER — Encounter: Payer: Self-pay | Admitting: Family Medicine

## 2023-03-27 ENCOUNTER — Ambulatory Visit (INDEPENDENT_AMBULATORY_CARE_PROVIDER_SITE_OTHER): Payer: Medicare Other | Admitting: Family Medicine

## 2023-03-27 VITALS — BP 130/70 | HR 98 | Temp 98.4°F | Ht 63.0 in | Wt 153.1 lb

## 2023-03-27 DIAGNOSIS — L03115 Cellulitis of right lower limb: Secondary | ICD-10-CM | POA: Insufficient documentation

## 2023-03-27 DIAGNOSIS — L02415 Cutaneous abscess of right lower limb: Secondary | ICD-10-CM | POA: Diagnosis not present

## 2023-03-27 MED ORDER — CEPHALEXIN 500 MG PO CAPS: 500.0000 mg | ORAL_CAPSULE | Freq: Three times a day (TID) | ORAL | 0 refills | Status: DC

## 2023-03-27 NOTE — Progress Notes (Signed)
Patient ID: Sue Conway, female    DOB: January 10, 1946, 77 y.o.   MRN: [id number]  This visit was conducted in person.  BP 130/70 (BP Location: Left Arm, Patient Position: Sitting, Cuff Size: Normal)   Pulse 98   Temp 98.4 F (36.9 C) (Temporal)   Ht 5\' 3"  (1.6 m)   Wt 153 lb 2 oz (69.5 kg)   LMP 05/19/1993   SpO2 98%   BMI 27.12 kg/m    CC:  Chief Complaint  Patient presents with   Skin Lesion    Right lower leg    Subjective:   HPI: Sue Conway is a 77 y.o. female presenting on 03/27/2023 for Skin Lesion (Right lower leg)   New onset skin lesion on right lower leg... first noted  red bump 2 week on right leg.. started itchy.  Treated with vaseline.   Lesion is increasing in size.Marland Kitchen occ sore to touch.  No discharge.   No history of skin issues in past.  No personal or family history of recurrent boil .      Relevant past medical, surgical, family and social history reviewed and updated as indicated. Interim medical history since our last visit reviewed. Allergies and medications reviewed and updated. Outpatient Medications Prior to Visit  Medication Sig Dispense Refill   amLODipine (NORVASC) 5 MG tablet TAKE 1 TABLET BY MOUTH DAILY 90 tablet 1   Cetirizine HCl (ZYRTEC PO) Take 1 tablet by mouth as needed. Reported on 11/08/2015     ezetimibe (ZETIA) 10 MG tablet TAKE 1 TABLET(10 MG) BY MOUTH DAILY 90 tablet 1   famotidine (PEPCID) 20 MG tablet TAKE 1 TABLET(20 MG) BY MOUTH TWICE DAILY 180 tablet 1   fluticasone (FLONASE) 50 MCG/ACT nasal spray INSTILL 2 SPRAYS INTO EACH NOSTRIL EVERY DAY 48 g 1   Multiple Vitamin (MULTIVITAMIN) tablet Take 1 tablet by mouth daily. Centrum silver daily     omeprazole (PRILOSEC) 20 MG capsule TAKE 1 CAPSULE BY MOUTH EVERY DAY 90 capsule 1   venlafaxine XR (EFFEXOR-XR) 37.5 MG 24 hr capsule TAKE 1 CAPSULE BY MOUTH DAILY WITH BREAKFAST. 90 capsule 1   vitamin B-12 (CYANOCOBALAMIN) 1000 MCG tablet Take 1,000 mcg by mouth daily.     No  facility-administered medications prior to visit.     Per HPI unless specifically indicated in ROS section below Review of Systems  Constitutional:  Negative for chills and fever.  HENT:  Negative for congestion and ear pain.   Eyes:  Negative for pain and redness.  Respiratory:  Negative for cough and shortness of breath.   Cardiovascular:  Negative for chest pain, palpitations and leg swelling.  Gastrointestinal:  Negative for abdominal pain, blood in stool, constipation, diarrhea, nausea and vomiting.  Genitourinary:  Negative for dysuria.  Musculoskeletal:  Negative for myalgias.  Skin:  Negative for rash.  Neurological:  Negative for dizziness.  Psychiatric/Behavioral:  The patient is not nervous/anxious.    Objective:  BP 130/70 (BP Location: Left Arm, Patient Position: Sitting, Cuff Size: Normal)   Pulse 98   Temp 98.4 F (36.9 C) (Temporal)   Ht 5\' 3"  (1.6 m)   Wt 153 lb 2 oz (69.5 kg)   LMP 05/19/1993   SpO2 98%   BMI 27.12 kg/m   Wt Readings from Last 3 Encounters:  03/27/23 153 lb 2 oz (69.5 kg)  11/25/22 157 lb (71.2 kg)  07/09/22 154 lb 6 oz (70 kg)      Physical Exam  Constitutional:      General: She is not in acute distress.    Appearance: Normal appearance. She is well-developed. She is not ill-appearing or toxic-appearing.  HENT:     Head: Normocephalic.     Right Ear: Hearing, tympanic membrane, ear canal and external ear normal. Tympanic membrane is not erythematous, retracted or bulging.     Left Ear: Hearing, tympanic membrane, ear canal and external ear normal. Tympanic membrane is not erythematous, retracted or bulging.     Nose: No mucosal edema or rhinorrhea.     Right Sinus: No maxillary sinus tenderness or frontal sinus tenderness.     Left Sinus: No maxillary sinus tenderness or frontal sinus tenderness.     Mouth/Throat:     Mouth: Oropharynx is clear and moist and mucous membranes are normal.     Pharynx: Uvula midline.  Eyes:      General: Lids are normal. Lids are everted, no foreign bodies appreciated.     Extraocular Movements: EOM normal.     Conjunctiva/sclera: Conjunctivae normal.     Pupils: Pupils are equal, round, and reactive to light.  Neck:     Thyroid: No thyroid mass or thyromegaly.     Vascular: No carotid bruit.     Trachea: Trachea normal.  Cardiovascular:     Rate and Rhythm: Normal rate and regular rhythm.     Pulses: Normal pulses.     Heart sounds: Normal heart sounds, S1 normal and S2 normal. No murmur heard.    No friction rub. No gallop.  Pulmonary:     Effort: Pulmonary effort is normal. No tachypnea or respiratory distress.     Breath sounds: Normal breath sounds. No decreased breath sounds, wheezing, rhonchi or rales.  Abdominal:     General: Bowel sounds are normal.     Palpations: Abdomen is soft.     Tenderness: There is no abdominal tenderness.  Musculoskeletal:     Cervical back: Normal range of motion and neck supple.  Skin:    General: Skin is warm, dry and intact.     Findings: Lesion present. No rash.  Neurological:     Mental Status: She is alert.  Psychiatric:        Mood and Affect: Mood is not anxious or depressed.        Speech: Speech normal.        Behavior: Behavior normal. Behavior is cooperative.        Thought Content: Thought content normal.        Cognition and Memory: Cognition and memory normal.        Judgment: Judgment normal.       Results for orders placed or performed in visit on 11/26/22  Lipid Panel  Result Value Ref Range   Cholesterol 192 0 - 200 mg/dL   Triglycerides 10.2 0.0 - 149.0 mg/dL   HDL 72.53 >66.44 mg/dL   VLDL 03.4 0.0 - 74.2 mg/dL   LDL Cholesterol 595 (H) 0 - 99 mg/dL   Total CHOL/HDL Ratio 3    NonHDL 124.56     Assessment and Plan  Cellulitis and abscess of right leg Assessment & Plan:  Acute, appearance most consistent with local bacterial infection/abscess.  Does not appear consistent with ringworm or yeast. No  central fluctuance or indication for incision and drainage in office today. Will treat with warm compresses 3 times daily, start oral antibiotics.  Chose Keflex 500 mg 3 times daily x 7 days, no increased risk  for MRSA or past exposure. Change daily antibacterial soap. She will follow-up for reevaluation early next week or sooner if redness spreading.  Return and ER precautions provided.   Other orders -     Cephalexin; Take 1 capsule (500 mg total) by mouth 3 (three) times daily.  Dispense: 21 capsule; Refill: 0    Return in about 5 days (around 04/01/2023) for  follow up   cellulitis/abscess.   Kerby Nora, MD

## 2023-03-27 NOTE — Patient Instructions (Addendum)
Start warm compresses 3 tomes daily for 10-15 min each time.  Apply topical antibiotic ointment.  Complete antibiotics course.   Call if  redness spreading or pain increasing, or fever on antibiotics.

## 2023-03-27 NOTE — Assessment & Plan Note (Signed)
Acute, appearance most consistent with local bacterial infection/abscess.  Does not appear consistent with ringworm or yeast. No central fluctuance or indication for incision and drainage in office today. Will treat with warm compresses 3 times daily, start oral antibiotics.  Chose Keflex 500 mg 3 times daily x 7 days, no increased risk for MRSA or past exposure. Change daily antibacterial soap. She will follow-up for reevaluation early next week or sooner if redness spreading.  Return and ER precautions provided.

## 2023-03-31 DIAGNOSIS — K080 Exfoliation of teeth due to systemic causes: Secondary | ICD-10-CM | POA: Diagnosis not present

## 2023-04-02 ENCOUNTER — Encounter: Payer: Self-pay | Admitting: Family Medicine

## 2023-04-02 ENCOUNTER — Ambulatory Visit: Payer: Medicare Other | Admitting: Family Medicine

## 2023-04-02 VITALS — BP 122/68 | HR 78 | Temp 97.8°F | Ht 63.0 in | Wt 154.5 lb

## 2023-04-02 DIAGNOSIS — L02415 Cutaneous abscess of right lower limb: Secondary | ICD-10-CM | POA: Diagnosis not present

## 2023-04-02 DIAGNOSIS — L03115 Cellulitis of right lower limb: Secondary | ICD-10-CM
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
## 2023-04-02 NOTE — Patient Instructions (Addendum)
Skin lesion looks better  [facility] your antibiotic  Keep area clean with soap and water  Continue warm compresses   Antibiotic ointment is fine  Watch for  Increased size, redness, pain (soreness) or streaks of redness or a bullseye pattern (rings around it)  Also watch for rash or fever

## 2023-04-02 NOTE — Assessment & Plan Note (Addendum)
Improved appearance today- erythema is much more pale/ no streaking or fluctuance  Reviewed notes/plan from Dr Aretha Parrot visit  Instructed to finish keflex  Keep clean with soap and water Antibiotic oint if needed Call back and Er precautions noted in detail today (see AVS)   Instructed to start using insect repent when working outdoors and in the woods

## 2023-05-15 ENCOUNTER — Ambulatory Visit: Payer: Medicare Other | Admitting: Family Medicine

## 2023-05-21 ENCOUNTER — Ambulatory Visit: Payer: Medicare Other | Admitting: Family Medicine

## 2023-05-21 ENCOUNTER — Ambulatory Visit (INDEPENDENT_AMBULATORY_CARE_PROVIDER_SITE_OTHER)
Admission: RE | Admit: 2023-05-21 | Discharge: 2023-05-21 | Disposition: A | Discharge status: Home / Self Care | Payer: Medicare Other | Source: Ambulatory Visit | Attending: Family Medicine | Admitting: Family Medicine

## 2023-05-21 ENCOUNTER — Encounter: Payer: Self-pay | Admitting: Family Medicine

## 2023-05-21 VITALS — BP 130/68 | HR 79 | Temp 98.2°F | Ht 63.0 in | Wt 153.1 lb

## 2023-05-21 DIAGNOSIS — R600 Localized edema: Secondary | ICD-10-CM | POA: Diagnosis not present

## 2023-05-21 DIAGNOSIS — M79672 Pain in left foot: Secondary | ICD-10-CM

## 2023-05-21 DIAGNOSIS — L02415 Cutaneous abscess of right lower limb: Secondary | ICD-10-CM

## 2023-05-21 DIAGNOSIS — L989 Disorder of the skin and subcutaneous tissue, unspecified: Secondary | ICD-10-CM

## 2023-05-21 DIAGNOSIS — M7989 Other specified soft tissue disorders: Secondary | ICD-10-CM | POA: Diagnosis not present

## 2023-05-21 DIAGNOSIS — K219 Gastro-esophageal reflux disease without esophagitis: Secondary | ICD-10-CM

## 2023-05-21 DIAGNOSIS — L03115 Cellulitis of right lower limb: Secondary | ICD-10-CM | POA: Diagnosis not present

## 2023-05-21 DIAGNOSIS — M19072 Primary osteoarthritis, left ankle and foot: Secondary | ICD-10-CM | POA: Diagnosis not present

## 2023-05-21 DIAGNOSIS — M7732 Calcaneal spur, left foot: Secondary | ICD-10-CM | POA: Diagnosis not present
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
## 2023-05-21 NOTE — Addendum Note (Signed)
 Addended by: Roxy Manns A on: 05/21/2023 06:42 PM   Modules accepted: Orders

## 2023-05-21 NOTE — Progress Notes (Signed)
 Referral done

## 2023-05-21 NOTE — Patient Instructions (Addendum)
 I put the referral in for dermatology  Please let us  know if you don't hear in 1-2 weeks  Keep area clean with soap and water   Xray of left foot today  We will call you with a result  You can use voltaren gel on the painful area over the counter  Ice / cold compress also   You may benefit from support hose to the waist for legs and veins   Since you are no longer on omeprazole  (prilosec) you don't have to take the vitamin B12  Stop the tumeric if it does not help any symptoms   For prediabetes  Try to get most of your carbohydrates from produce (with the exception of white potatoes) and whole grains Eat less bread/pasta/rice/snack foods/cereals/sweets and other items from the middle of the grocery store (processed carbs)

## 2023-05-21 NOTE — Assessment & Plan Note (Addendum)
 Some focal swelling and tenderness of proximal 5th metatarsal area  Does not remember trauma or ankle inversion  Some mild injection but not warm and not so tender that I would suspect gout   Xray of foot today was neg for fracture or acute joint change  Recommend trial of voltaren gel over the counter as directed Also cold compress  Consider ref to podiatry

## 2023-05-21 NOTE — Assessment & Plan Note (Signed)
 Some spider veins up to mid thigh No longer on hydrochlorothiazide  Recommend supp hose to waist for this / may help leg discomfort as well  Elevate when sitting if able  Avoid processed food/excess sodium

## 2023-05-21 NOTE — Assessment & Plan Note (Signed)
 No longer needs or takes omeprazole   Pt asked about vitamin B12-unsure she needs at this time if not taking ppi

## 2023-05-21 NOTE — Assessment & Plan Note (Signed)
 Cellulitis is resolved  Small area of erythema and scale persist now   Referral made to dermatology

## 2023-05-21 NOTE — Assessment & Plan Note (Signed)
 This persisted after bout of cellulitis (pic in chart)  Raised with some erythema and central scale / mildly tender  Referral made to dermatology  ? If post traumatic , but want to r/o skin cancer as well

## 2023-06-04 ENCOUNTER — Ambulatory Visit: Payer: Medicare Other | Admitting: Podiatry

## 2023-06-04 ENCOUNTER — Encounter: Payer: Self-pay | Admitting: Podiatry

## 2023-06-04 VITALS — Ht 63.0 in | Wt 153.1 lb

## 2023-06-04 DIAGNOSIS — M7672 Peroneal tendinitis, left leg: Secondary | ICD-10-CM | POA: Diagnosis not present

## 2023-06-04 NOTE — Progress Notes (Signed)
Subjective:  Patient ID: Sue Conway, female    DOB: 02/08/1946,  MRN: [id number]  Chief Complaint  Patient presents with   Foot Pain    Pt is here due to left foot pain, pt states the pain has been there for a while and gradually the pain has gotten even worst, states there is a bump on the side of her foot, X-RAYs were done by her PCP on 1/2 and told there were no abnormal findings.    78 y.o. female presents with the above complaint.  Patient presents with complaint of left lateral foot pain.  Patient states is painful to touch is progressive gotten worse worse with ambulation is with pressure she has she has not seen MRIs prior to seeing me.  Gradually the pain keeps going up.  Pain scale 7 out of 10 dull ache in nature   Review of Systems: Negative except as noted in the HPI. Denies N/V/F/Ch.  Past Medical History:  Diagnosis Date   Allergy    Anemia    slight- no tx as of yet    Cataract    removed bilat with lens to left eye    Chest pain    Diverticulosis of colon    BE mild diverticulosis 01/1995   Elevated blood sugar level    Glaucoma 09/24/2005   history of glaucoma (Dr. Ranell Patrick)   Hyperlipidemia    Hypertension    eye drops elevated BP- not discontined since per pt.   Osteopenia    Ringing in ears    worse with aspirin   Tinnitus     Current Outpatient Medications:    amLODipine (NORVASC) 5 MG tablet, TAKE 1 TABLET BY MOUTH DAILY, Disp: 90 tablet, Rfl: 1   Cetirizine HCl (ZYRTEC PO), Take 1 tablet by mouth as needed. Reported on 11/08/2015, Disp: , Rfl:    ezetimibe (ZETIA) 10 MG tablet, TAKE 1 TABLET(10 MG) BY MOUTH DAILY, Disp: 90 tablet, Rfl: 1   fluticasone (FLONASE) 50 MCG/ACT nasal spray, INSTILL 2 SPRAYS INTO EACH NOSTRIL EVERY DAY, Disp: 48 g, Rfl: 1   Multiple Vitamin (MULTIVITAMIN) tablet, Take 1 tablet by mouth daily. Centrum silver daily, Disp: , Rfl:    TURMERIC CURCUMIN PO, Take 1 capsule by mouth daily., Disp: , Rfl:    venlafaxine XR  (EFFEXOR-XR) 37.5 MG 24 hr capsule, TAKE 1 CAPSULE BY MOUTH DAILY WITH BREAKFAST., Disp: 90 capsule, Rfl: 1   vitamin B-12 (CYANOCOBALAMIN) 1000 MCG tablet, Take 1,000 mcg by mouth daily., Disp: , Rfl:    Vitamin D, Ergocalciferol, 50 MCG (2000 UT) CAPS, Take 1 capsule by mouth daily., Disp: , Rfl:   Social History   Tobacco Use  Smoking Status Never   Passive exposure: Past  Smokeless Tobacco Never    Allergies  Allergen Reactions   Aspirin     REACTION: worsens her tinnitis   Atorvastatin     Leg muscle pain    Prozac [Fluoxetine Hcl]    Zoloft [Sertraline Hcl]    Objective:  There were no vitals filed for this visit. Body mass index is 27.13 kg/m. Constitutional Well developed. Well nourished.  Vascular Dorsalis pedis pulses palpable bilaterally. Posterior tibial pulses palpable bilaterally. Capillary refill normal to all digits.  No cyanosis or clubbing noted. Pedal hair growth normal.  Neurologic Normal speech. Oriented to person, place, and time. Epicritic sensation to light touch grossly present bilaterally.  Dermatologic Nails well groomed and normal in appearance. No open wounds. No skin  lesions.  Orthopedic: Pain on palpation along the course of the peroneal tendon pain with resisted dorsiflexion eversion of the foot no pain with plantarflexion and origin of the foot.Marland Kitchen  No pain at ATFL ligament no pain to posterior tibial tendon Achilles tendon.   Radiographs: No Assessment:   1. Peroneal tendinitis of left lower extremity    Plan:  Patient was evaluated and treated and all questions answered.  Patient left peroneal tendinitis -All questions and concerns were discussed with the patient in extensive detail given the amount of pain that she has been she will benefit from cam boot immobilization Cam boot was dispensed if there is still some residual pain we discussed her injection versus MRI during next visit.  No follow-ups on file.

## 2023-06-11 ENCOUNTER — Telehealth: Payer: Self-pay

## 2023-06-11 NOTE — Telephone Encounter (Signed)
Copied from CRM [id number]. Topic: General - Other >> Jun 11, 2023 10:19 AM Elizebeth Brooking wrote: Reason for CRM: Patient called in stating to informed Dr.Tower that she has not heard from a dermatologist yet, and was informed that if she hasn't heard back to give her a callback

## 2023-06-11 NOTE — Telephone Encounter (Signed)
Please advise on status of referral

## 2023-06-25 DIAGNOSIS — D485 Neoplasm of uncertain behavior of skin: Secondary | ICD-10-CM | POA: Diagnosis not present

## 2023-06-25 DIAGNOSIS — L720 Epidermal cyst: Secondary | ICD-10-CM | POA: Diagnosis not present

## 2023-07-01 ENCOUNTER — Ambulatory Visit (INDEPENDENT_AMBULATORY_CARE_PROVIDER_SITE_OTHER): Payer: Medicare Other | Admitting: Podiatry

## 2023-07-01 DIAGNOSIS — Q666 Other congenital valgus deformities of feet: Secondary | ICD-10-CM | POA: Diagnosis not present

## 2023-07-01 DIAGNOSIS — M7671 Peroneal tendinitis, right leg: Secondary | ICD-10-CM

## 2023-07-01 DIAGNOSIS — M7672 Peroneal tendinitis, left leg: Secondary | ICD-10-CM

## 2023-07-01 NOTE — Progress Notes (Signed)
Subjective:  Patient ID: Sue Conway, female    DOB: 10-Sep-1945,  MRN: [id number]  Chief Complaint  Patient presents with   Foot Pain    Pt stated that it is doing better than it was     78 y.o. female presents with the above complaint.  Patient presents with follow-up of left peroneal tendinitis pain on palpation..  The cam boot immobilization help little bit she still has some get this pain.  Denies any other acute complaints little bit better than what it was   Review of Systems: Negative except as noted in the HPI. Denies N/V/F/Ch.  Past Medical History:  Diagnosis Date   Allergy    Anemia    slight- no tx as of yet    Cataract    removed bilat with lens to left eye    Chest pain    Diverticulosis of colon    BE mild diverticulosis 01/1995   Elevated blood sugar level    Glaucoma 09/24/2005   history of glaucoma (Dr. Ranell Patrick)   Hyperlipidemia    Hypertension    eye drops elevated BP- not discontined since per pt.   Osteopenia    Ringing in ears    worse with aspirin   Tinnitus     Current Outpatient Medications:    amLODipine (NORVASC) 5 MG tablet, TAKE 1 TABLET BY MOUTH DAILY, Disp: 90 tablet, Rfl: 1   Cetirizine HCl (ZYRTEC PO), Take 1 tablet by mouth as needed. Reported on 11/08/2015, Disp: , Rfl:    ezetimibe (ZETIA) 10 MG tablet, TAKE 1 TABLET(10 MG) BY MOUTH DAILY, Disp: 90 tablet, Rfl: 1   fluticasone (FLONASE) 50 MCG/ACT nasal spray, INSTILL 2 SPRAYS INTO EACH NOSTRIL EVERY DAY, Disp: 48 g, Rfl: 1   Multiple Vitamin (MULTIVITAMIN) tablet, Take 1 tablet by mouth daily. Centrum silver daily, Disp: , Rfl:    TURMERIC CURCUMIN PO, Take 1 capsule by mouth daily., Disp: , Rfl:    venlafaxine XR (EFFEXOR-XR) 37.5 MG 24 hr capsule, TAKE 1 CAPSULE BY MOUTH DAILY WITH BREAKFAST., Disp: 90 capsule, Rfl: 1   vitamin B-12 (CYANOCOBALAMIN) 1000 MCG tablet, Take 1,000 mcg by mouth daily., Disp: , Rfl:    Vitamin D, Ergocalciferol, 50 MCG (2000 UT) CAPS, Take 1 capsule by  mouth daily., Disp: , Rfl:   Social History   Tobacco Use  Smoking Status Never   Passive exposure: Past  Smokeless Tobacco Never    Allergies  Allergen Reactions   Aspirin     REACTION: worsens her tinnitis   Atorvastatin     Leg muscle pain    Prozac [Fluoxetine Hcl]    Zoloft [Sertraline Hcl]    Objective:  There were no vitals filed for this visit. There is no height or weight on file to calculate BMI. Constitutional Well developed. Well nourished.  Vascular Dorsalis pedis pulses palpable bilaterally. Posterior tibial pulses palpable bilaterally. Capillary refill normal to all digits.  No cyanosis or clubbing noted. Pedal hair growth normal.  Neurologic Normal speech. Oriented to person, place, and time. Epicritic sensation to light touch grossly present bilaterally.  Dermatologic Nails well groomed and normal in appearance. No open wounds. No skin lesions.  Orthopedic: Pain on palpation along the course of the peroneal tendon pain with resisted dorsiflexion eversion of the foot no pain with plantarflexion and origin of the foot.Marland Kitchen  No pain at ATFL ligament no pain to posterior tibial tendon Achilles tendon.  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  The arch with dorsiflexion of the hallux   Radiographs: No Assessment:   No diagnosis found.  Plan:  Patient was evaluated and treated and all questions answered.  Patient left peroneal tendinitis with underlying driving force pes planovalgus -All questions concerns were discussed with the patient in extensive detail.  Given the amount of pain that she is having she will benefit from steroid injection of decrease inflammatory component surgical pain.  Patient agrees with plan to proceed with steroid injection. -Shoe gear modification was discussed -I discussed over-the-counter orthotics options.  She states understanding. No follow-ups on file.

## 2023-07-06 ENCOUNTER — Telehealth: Payer: Self-pay | Admitting: Family Medicine

## 2023-07-06 DIAGNOSIS — E7800 Pure hypercholesterolemia, unspecified: Secondary | ICD-10-CM

## 2023-07-06 DIAGNOSIS — I10 Essential (primary) hypertension: Secondary | ICD-10-CM

## 2023-07-06 DIAGNOSIS — R7303 Prediabetes: Secondary | ICD-10-CM

## 2023-07-06 NOTE — Telephone Encounter (Signed)
-----   Message from Alvina Chou sent at 06/22/2023 11:02 AM EST ----- Regarding: lab orders forTue, 2.18.25 Patient is scheduled for CPX labs, please order future labs, Thanks , Camelia Eng

## 2023-07-07 ENCOUNTER — Ambulatory Visit
Admission: RE | Admit: 2023-07-07 | Discharge: 2023-07-07 | Disposition: A | Discharge status: Home / Self Care | Payer: Medicare Other | Source: Ambulatory Visit | Attending: Family Medicine | Admitting: Family Medicine

## 2023-07-07 ENCOUNTER — Other Ambulatory Visit: Payer: Medicare Other

## 2023-07-07 DIAGNOSIS — R052 Subacute cough: Secondary | ICD-10-CM | POA: Diagnosis not present

## 2023-07-07 DIAGNOSIS — R7303 Prediabetes: Secondary | ICD-10-CM | POA: Diagnosis not present

## 2023-07-07 DIAGNOSIS — I10 Essential (primary) hypertension: Secondary | ICD-10-CM

## 2023-07-07 DIAGNOSIS — R059 Cough, unspecified: Secondary | ICD-10-CM | POA: Diagnosis not present

## 2023-07-07 LAB — COMPREHENSIVE METABOLIC PANEL
ALT: 13 U/L (ref 0–35)
AST: 16 U/L (ref 0–37)
Albumin: 4.2 g/dL (ref 3.5–5.2)
Alkaline Phosphatase: 73 U/L (ref 39–117)
BUN: 15 mg/dL (ref 6–23)
CO2: 30 meq/L (ref 19–32)
Calcium: 9.3 mg/dL (ref 8.4–10.5)
Chloride: 107 meq/L (ref 96–112)
Creatinine, Ser: 0.75 mg/dL (ref 0.40–1.20)
GFR: 75.61 mL/min (ref >60.00)
Glucose, Bld: 93 mg/dL (ref 70–99)
Potassium: 4.1 meq/L (ref 3.5–5.1)
Sodium: 143 meq/L (ref 135–145)
Total Bilirubin: 0.4 mg/dL (ref 0.2–1.2)
Total Protein: 7 g/dL (ref 6.0–8.3)

## 2023-07-07 LAB — LIPID PANEL
Cholesterol: 173 mg/dL (ref 0–200)
HDL: 72.4 mg/dL (ref >39.00)
LDL Cholesterol: 86 mg/dL (ref 0–99)
NonHDL: 100.32
Total CHOL/HDL Ratio: 2
Triglycerides: 71 mg/dL (ref 0.0–149.0)
VLDL: 14.2 mg/dL (ref 0.0–40.0)

## 2023-07-07 LAB — CBC WITH DIFFERENTIAL/PLATELET
Basophils Absolute: 0 10*3/uL (ref 0.0–0.1)
Basophils Relative: 0.6 % (ref 0.0–3.0)
Eosinophils Absolute: 0.1 10*3/uL (ref 0.0–0.7)
Eosinophils Relative: 1.8 % (ref 0.0–5.0)
HCT: 36.7 % (ref 36.0–46.0)
Hemoglobin: 12 g/dL (ref 12.0–15.0)
Lymphocytes Relative: 34.5 % (ref 12.0–46.0)
Lymphs Abs: 1.6 10*3/uL (ref 0.7–4.0)
MCHC: 32.6 g/dL (ref 30.0–36.0)
MCV: 82.5 fL (ref 78.0–100.0)
Monocytes Absolute: 0.3 10*3/uL (ref 0.1–1.0)
Monocytes Relative: 6.3 % (ref 3.0–12.0)
Neutro Abs: 2.7 10*3/uL (ref 1.4–7.7)
Neutrophils Relative %: 56.8 % (ref 43.0–77.0)
Platelets: 274 10*3/uL (ref 150.0–400.0)
RBC: 4.45 Mil/uL (ref 3.87–5.11)
RDW: 16 % — ABNORMAL HIGH (ref 11.5–15.5)
WBC: 4.7 10*3/uL (ref 4.0–10.5)

## 2023-07-07 LAB — TSH: TSH: 1.98 u[IU]/mL (ref 0.35–5.50)

## 2023-07-07 LAB — HEMOGLOBIN A1C: Hgb A1c MFr Bld: 6.1 % (ref 4.6–6.5)

## 2023-07-14 ENCOUNTER — Ambulatory Visit (INDEPENDENT_AMBULATORY_CARE_PROVIDER_SITE_OTHER): Payer: Medicare Other | Admitting: Family Medicine

## 2023-07-14 ENCOUNTER — Encounter: Payer: Self-pay | Admitting: Family Medicine

## 2023-07-14 VITALS — BP 126/72 | HR 70 | Temp 97.7°F | Ht 62.75 in | Wt 149.0 lb

## 2023-07-14 DIAGNOSIS — I10 Essential (primary) hypertension: Secondary | ICD-10-CM

## 2023-07-14 DIAGNOSIS — M79672 Pain in left foot: Secondary | ICD-10-CM

## 2023-07-14 DIAGNOSIS — R7303 Prediabetes: Secondary | ICD-10-CM

## 2023-07-14 DIAGNOSIS — Z0000 Encounter for general adult medical examination without abnormal findings: Secondary | ICD-10-CM | POA: Diagnosis not present

## 2023-07-14 DIAGNOSIS — Z1231 Encounter for screening mammogram for malignant neoplasm of breast: Secondary | ICD-10-CM

## 2023-07-14 DIAGNOSIS — M8588 Other specified disorders of bone density and structure, other site: Secondary | ICD-10-CM | POA: Diagnosis not present

## 2023-07-14 DIAGNOSIS — Z1211 Encounter for screening for malignant neoplasm of colon: Secondary | ICD-10-CM

## 2023-07-14 DIAGNOSIS — F430 Acute stress reaction: Secondary | ICD-10-CM

## 2023-07-14 DIAGNOSIS — E7800 Pure hypercholesterolemia, unspecified: Secondary | ICD-10-CM | POA: Diagnosis not present

## 2023-07-14 DIAGNOSIS — L989 Disorder of the skin and subcutaneous tissue, unspecified: Secondary | ICD-10-CM

## 2023-07-14 DIAGNOSIS — Z636 Dependent relative needing care at home: Secondary | ICD-10-CM

## 2023-07-14 MED ORDER — FLUTICASONE PROPIONATE 50 MCG/ACT NA SUSP: 2.0000 | Freq: Every day | NASAL | 3 refills | Status: AC

## 2023-07-14 MED ORDER — AMLODIPINE BESYLATE 5 MG PO TABS: 5.0000 mg | ORAL_TABLET | Freq: Every day | ORAL | 3 refills | Status: AC

## 2023-07-14 MED ORDER — EZETIMIBE 10 MG PO TABS: 10.0000 mg | ORAL_TABLET | Freq: Every day | ORAL | 3 refills | Status: AC

## 2023-07-14 MED ORDER — VENLAFAXINE HCL ER 37.5 MG PO CP24: 37.5000 mg | ORAL_CAPSULE | Freq: Every day | ORAL | 3 refills | Status: AC

## 2023-07-14 NOTE — Assessment & Plan Note (Signed)
Mammogram ordered °Pt will call to schedule  °

## 2023-07-14 NOTE — Patient Instructions (Addendum)
 If you are interested in the new shingles vaccine (Shingrix) - call your local pharmacy to check on coverage and availability    Continue the multi vitamin daily  Continue the vitamin D as well   Stay active Add some strength training to your routine, this is important for bone and brain health and can reduce your risk of falls and help your body use insulin properly and regulate weight  Light weights, exercise bands , and internet videos are a good way to start  Yoga (chair or regular), machines , floor exercises or a gym with machines are also good options    Try to get most of your carbohydrates from produce (with the exception of white potatoes) and whole grains Eat less bread/pasta/rice/snack foods/cereals/sweets and other items from the middle of the grocery store (processed carbs)    You have an order for:  []   2D Mammogram  [x]   3D Mammogram  []   Bone Density     Please call for appointment:   [x]   [facility]  [address] [place] [address]  [phone]  []   [facility])   [address]  [place], [place] [address]  [phone]  []   [facility]      [address] [place], [place]        [phone]         []   [facility]  [address] [place], [place]  [phone]  []  [facility]   [address]   [place], [place] [address]  [phone]  []  [facility]  [address] [place], [place] [address] [phone]    Make sure to wear two piece clothing  No lotions powders or deodorants the day of the appointment Make sure to bring picture ID and insurance card.  Bring list of medications you are currently taking including any supplements.   Schedule your screening mammogram through MyChart!    Select [place] imaging sites can now be scheduled through MyChart.  Log into your MyChart account.  Go to 'Visit' (or 'Appointments' if  on mobile App) --> Schedule an  Appointment  Under 'Select a Reason for Visit' choose the Mammogram  Screening option.  Complete the pre-visit questions  and select the time and place that  best fits your schedule

## 2023-07-14 NOTE — Assessment & Plan Note (Signed)
 Disc goals for lipids and reasons to control them Rev last labs with pt Rev low sat fat diet in detail  LDL is down to 86 with zetia 10 mg daily and diet

## 2023-07-14 NOTE — Assessment & Plan Note (Signed)
 Wore a boot from podiatry  Had injection Doing better

## 2023-07-14 NOTE — Assessment & Plan Note (Signed)
 Lab Results  Component Value Date   HGBA1C 6.1 07/07/2023   HGBA1C 6.0 07/02/2022   HGBA1C 6.0 11/06/2021   disc imp of low glycemic diet and wt loss to prevent DM2

## 2023-07-14 NOTE — Assessment & Plan Note (Signed)
 With anx/depressed mood at times  Effexor xr 37.5 daily is very helpful  Doing better   In grief counseling at church for loss of friend also

## 2023-07-14 NOTE — Assessment & Plan Note (Signed)
 Dexa 01/2023  No falls or fracture  Takes mvi Encouraged to add back D3 2000 international units daily   Discussed fall prevention, supplements and exercise for bone density  Encouraged strength training as tolerated
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
## 2023-07-14 NOTE — Assessment & Plan Note (Signed)
 bp in fair control at this time  BP Readings from Last 1 Encounters:  07/14/23 126/72   No changes needed Most recent labs reviewed  Disc lifstyle change with low sodium diet and exercise  Plan to continue amlodipine 5 mg daily

## 2023-07-14 NOTE — Assessment & Plan Note (Signed)
 Colonoscopy 2019 with no recall

## 2023-07-14 NOTE — Assessment & Plan Note (Signed)
 Husband is doing better recently

## 2023-07-14 NOTE — Assessment & Plan Note (Addendum)
 Reviewed health habits including diet and exercise and skin cancer prevention Reviewed appropriate screening tests for age  Also reviewed health mt list, fam hx and immunization status , as well as social and family history   See HPI Labs reviewed and ordered Health Maintenance  Topic Date Due   COVID-19 Vaccine (5 - 2024-25 season) 06/05/2024*   Zoster (Shingles) Vaccine (1 of 2) 10/10/2024*   Mammogram  08/06/2023   Medicare Annual Wellness Visit  07/13/2024   DTaP/Tdap/Td vaccine (3 - Td or Tdap) 11/27/2032   Pneumonia Vaccine  Completed   Flu Shot  Completed   DEXA scan (bone density measurement)  Completed   Hepatitis C Screening  Completed   HPV Vaccine  Aged Out   Colon Cancer Screening  Discontinued  *Topic was postponed. The date shown is not the original due date.   Mammogram ordered  Considering shingrix at pharmacy  Discussed fall prevention, supplements and exercise for bone density  PHQ elevated after loss of friend/ is doing grief counseling at her church

## 2023-07-14 NOTE — Assessment & Plan Note (Signed)
 Did see derm Bx was b9 Healing now

## 2023-07-20 ENCOUNTER — Encounter: Payer: Self-pay | Admitting: Family Medicine

## 2023-07-31 ENCOUNTER — Ambulatory Visit: Payer: Medicare Other | Admitting: Podiatry

## 2023-07-31 ENCOUNTER — Encounter: Payer: Self-pay | Admitting: Podiatry

## 2023-07-31 DIAGNOSIS — M7672 Peroneal tendinitis, left leg: Secondary | ICD-10-CM | POA: Diagnosis not present

## 2023-07-31 NOTE — Progress Notes (Signed)
 Subjective:  Patient ID: Sue Conway, female    DOB: 11/26/1945,  MRN: [id number]  Chief Complaint  Patient presents with   Routine Post Op    F/u for Peroneal tendinitis of left lower extremity. Pt states that she noticed more achy pain when she is standing and walking more. She is compliant with wearing brace. She would like to discuss something she saw on an xray with you.    78 y.o. female presents with the above complaint.  Patient presents with follow-up of left peroneal tendinitis pain on palpation..  The cam boot immobilization help little bit she still has some get this pain.  Denies any other acute complaints little bit better than what it was   Review of Systems: Negative except as noted in the HPI. Denies N/V/F/Ch.  Past Medical History:  Diagnosis Date   Allergy    Anemia    slight- no tx as of yet    Cataract    removed bilat with lens to left eye    Chest pain    Diverticulosis of colon    BE mild diverticulosis 01/1995   Elevated blood sugar level    Glaucoma 09/24/2005   history of glaucoma (Dr. Ranell Patrick)   Hyperlipidemia    Hypertension    eye drops elevated BP- not discontined since per pt.   Osteopenia    Ringing in ears    worse with aspirin   Tinnitus     Current Outpatient Medications:    amLODipine (NORVASC) 5 MG tablet, Take 1 tablet (5 mg total) by mouth daily., Disp: 90 tablet, Rfl: 3   Cetirizine HCl (ZYRTEC PO), Take 1 tablet by mouth as needed. Reported on 11/08/2015, Disp: , Rfl:    cholecalciferol (VITAMIN D3) 25 MCG (1000 UNIT) tablet, Take 2,000 Units by mouth daily., Disp: , Rfl:    ezetimibe (ZETIA) 10 MG tablet, Take 1 tablet (10 mg total) by mouth daily., Disp: 90 tablet, Rfl: 3   fluticasone (FLONASE) 50 MCG/ACT nasal spray, Place 2 sprays into both nostrils daily., Disp: 48 g, Rfl: 3   Multiple Vitamin (MULTIVITAMIN) tablet, Take 1 tablet by mouth daily. Centrum silver daily, Disp: , Rfl:    venlafaxine XR (EFFEXOR-XR) 37.5 MG 24  hr capsule, Take 1 capsule (37.5 mg total) by mouth daily with breakfast., Disp: 90 capsule, Rfl: 3  Social History   Tobacco Use  Smoking Status Never   Passive exposure: Past  Smokeless Tobacco Never    Allergies  Allergen Reactions   Aspirin     REACTION: worsens her tinnitis   Atorvastatin     Leg muscle pain    Prozac [Fluoxetine Hcl]    Zoloft [Sertraline Hcl]    Objective:  There were no vitals filed for this visit. There is no height or weight on file to calculate BMI. Constitutional Well developed. Well nourished.  Vascular Dorsalis pedis pulses palpable bilaterally. Posterior tibial pulses palpable bilaterally. Capillary refill normal to all digits.  No cyanosis or clubbing noted. Pedal hair growth normal.  Neurologic Normal speech. Oriented to person, place, and time. Epicritic sensation to light touch grossly present bilaterally.  Dermatologic Nails well groomed and normal in appearance. No open wounds. No skin lesions.  Orthopedic: Pain on palpation along the course of the peroneal tendon pain with resisted dorsiflexion eversion of the foot no pain with plantarflexion and origin of the foot.Marland Kitchen  No pain at ATFL ligament no pain to posterior tibial tendon Achilles tendon.  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  The arch with dorsiflexion of the hallux   Radiographs: No Assessment:   1. Peroneal tendinitis of left lower extremity     Plan:  Patient was evaluated and treated and all questions answered.  Patient left peroneal tendinitis with underlying driving force pes planovalgus -All questions concerns were discussed with the patient in extensive detail clinically given that her pain has not improved and she continues to cause more achy pain she would like to discuss next treatment plan this time patient will benefit from an MRI evaluation to rule out tear patient agrees with the plan -MRI was  ordered No follow-ups on file.

## 2023-08-03 ENCOUNTER — Encounter: Payer: Self-pay | Admitting: Podiatry

## 2023-08-03 NOTE — Progress Notes (Unsigned)
 Per Vashti Hey at [facility], had to cancel this patient for tomorrow her authorization is still pending with an estimated determination date of 03/24. She is aware

## 2023-08-04 ENCOUNTER — Other Ambulatory Visit

## 2023-08-21 ENCOUNTER — Ambulatory Visit
Admission: RE | Admit: 2023-08-21 | Discharge: 2023-08-21 | Disposition: A | Discharge status: Home / Self Care | Source: Ambulatory Visit | Attending: Family Medicine | Admitting: Family Medicine

## 2023-08-21 DIAGNOSIS — Z1231 Encounter for screening mammogram for malignant neoplasm of breast: Secondary | ICD-10-CM | POA: Diagnosis not present

## 2023-08-25 ENCOUNTER — Encounter: Payer: Self-pay | Admitting: Family Medicine

## 2023-09-21 DIAGNOSIS — Z9841 Cataract extraction status, right eye: Secondary | ICD-10-CM | POA: Diagnosis not present

## 2023-09-21 DIAGNOSIS — Z9842 Cataract extraction status, left eye: Secondary | ICD-10-CM | POA: Diagnosis not present

## 2023-09-21 DIAGNOSIS — H5213 Myopia, bilateral: Secondary | ICD-10-CM | POA: Diagnosis not present

## 2023-10-19 DIAGNOSIS — K080 Exfoliation of teeth due to systemic causes: Secondary | ICD-10-CM | POA: Diagnosis not present

## 2023-11-26 ENCOUNTER — Ambulatory Visit: Payer: Medicare Other

## 2023-11-26 VITALS — Ht 63.0 in | Wt 149.0 lb

## 2023-11-26 DIAGNOSIS — Z0000 Encounter for general adult medical examination without abnormal findings: Secondary | ICD-10-CM

## 2023-11-26 NOTE — Progress Notes (Signed)
 Subjective:   Sue Conway is a 78 y.o. who presents for a Medicare Wellness preventive visit.  As a reminder, Annual Wellness Visits don't include a physical exam, and some assessments may be limited, especially if this visit is performed virtually. We may recommend an in-person follow-up visit with your provider if needed.  Visit Complete: Virtual I connected with  Sue Conway on 11/26/23 by a audio enabled telemedicine application and verified that I am speaking with the correct person using two identifiers.  Patient Location: Home  Provider Location: Home Office  I discussed the limitations of evaluation and management by telemedicine. The patient expressed understanding and agreed to proceed.  Vital Signs: Because this visit was a virtual/telehealth visit, some criteria may be missing or patient reported. Any vitals not documented were not able to be obtained and vitals that have been documented are patient reported.  VideoDeclined- This patient declined Librarian, academic. Therefore the visit was completed with audio only.  Persons Participating in Visit: Patient.  AWV Questionnaire: No: Patient Medicare AWV questionnaire was not completed prior to this visit.  Cardiac Risk Factors include: advanced age (>60men, >64 women);dyslipidemia;hypertension;sedentary lifestyle     Objective:    Today's Vitals   11/26/23 1544  Weight: 149 lb (67.6 kg)  Height: 5' 3 (1.6 m)   Body mass index is 26.39 kg/m.     11/26/2023    3:53 PM 11/25/2022    3:43 PM 09/19/2021   10:01 AM 11/08/2019    7:58 PM 05/18/2019    9:03 AM 05/05/2018    9:02 AM 11/08/2015   11:16 AM  Advanced Directives  Does Patient Have a Medical Advance Directive? No No No No No No  No   Would patient like information on creating a medical advance directive?  No - Patient declined No - Patient declined No - Patient declined No - Patient declined No - Patient declined  No - patient  declined information      Data saved with a previous flowsheet row definition    Current Medications (verified) Outpatient Encounter Medications as of 11/26/2023  Medication Sig   amLODipine  (NORVASC ) 5 MG tablet Take 1 tablet (5 mg total) by mouth daily.   Cetirizine HCl (ZYRTEC PO) Take 1 tablet by mouth as needed. Reported on 11/08/2015   cholecalciferol (VITAMIN D3) 25 MCG (1000 UNIT) tablet Take 2,000 Units by mouth daily.   ezetimibe  (ZETIA ) 10 MG tablet Take 1 tablet (10 mg total) by mouth daily.   fluticasone  (FLONASE ) 50 MCG/ACT nasal spray Place 2 sprays into both nostrils daily.   Multiple Vitamin (MULTIVITAMIN) tablet Take 1 tablet by mouth daily. Centrum silver daily   venlafaxine  XR (EFFEXOR -XR) 37.5 MG 24 hr capsule Take 1 capsule (37.5 mg total) by mouth daily with breakfast.   No facility-administered encounter medications on file as of 11/26/2023.    Allergies (verified) Aspirin, Atorvastatin, Prozac [fluoxetine hcl], and Zoloft [sertraline hcl]   History: Past Medical History:  Diagnosis Date   Allergy    Anemia    slight- no tx as of yet    Cataract    removed bilat with lens to left eye    Chest pain    Diverticulosis of colon    BE mild diverticulosis 01/1995   Elevated blood sugar level    Glaucoma 09/24/2005   history of glaucoma (Dr. Charlena Sink)   Hyperlipidemia    Hypertension    eye drops elevated BP- not discontined  since per pt.   Osteopenia    Ringing in ears    worse with aspirin   Tinnitus    Past Surgical History:  Procedure Laterality Date   ABDOMINAL HYSTERECTOMY  1999   total hysterectomy fibroids   BREAST BIOPSY N/A 2013   needle,benign   CATARACT EXTRACTION, BILATERAL     CEREBRAL ANEURYSM REPAIR  02/2003   COLONOSCOPY     FINE NEEDLE ASPIRATION  06/2010   breast   OOPHORECTOMY     Family History  Problem Relation Age of Onset   Diabetes Mother    Hypertension Mother    Heart disease Mother        CHF   Diabetes Father     Diabetes Brother    Heart disease Brother 24       CVA and MI   Breast cancer Neg Hx    Colon cancer Neg Hx    Colon polyps Neg Hx    Esophageal cancer Neg Hx    Rectal cancer Neg Hx    Stomach cancer Neg Hx    Social History   Socioeconomic History   Marital status: Married    Spouse name: Not on file   Number of children: Not on file   Years of education: Not on file   Highest education level: Not on file  Occupational History   Not on file  Tobacco Use   Smoking status: Never    Passive exposure: Past   Smokeless tobacco: Never  Vaping Use   Vaping status: Never Used  Substance and Sexual Activity   Alcohol use: No    Alcohol/week: 0.0 standard drinks of alcohol   Drug use: No   Sexual activity: Never  Other Topics Concern   Not on file  Social History Narrative   Not on file   Social Drivers of Health   Financial Resource Strain: Low Risk  (11/26/2023)   Overall Financial Resource Strain (CARDIA)    Difficulty of Paying Living Expenses: Not hard at all  Food Insecurity: No Food Insecurity (11/26/2023)   Hunger Vital Sign    Worried About Running Out of Food in the Last Year: Never true    Ran Out of Food in the Last Year: Never true  Transportation Needs: No Transportation Needs (11/26/2023)   PRAPARE - Administrator, Civil Service (Medical): No    Lack of Transportation (Non-Medical): No  Physical Activity: Inactive (11/26/2023)   Exercise Vital Sign    Days of Exercise per Week: 0 days    Minutes of Exercise per Session: 0 min  Stress: No Stress Concern Present (11/26/2023)   [employer] of Occupational Health - Occupational Stress Questionnaire    Feeling of Stress: Not at all  Social Connections: Socially Integrated (11/26/2023)   Social Connection and Isolation Panel    Frequency of Communication with Friends and Family: More than three times a week    Frequency of Social Gatherings with Friends and Family: More than three times a  week    Attends Religious Services: More than 4 times per year    Active Member of [employer] or Organizations: Yes    Attends Engineer, structural: More than 4 times per year    Marital Status: Married    Tobacco Counseling Counseling given: Not Answered    Clinical Intake:  Pre-visit preparation completed: Yes  Pain : No/denies pain     BMI - recorded: 26.39 Nutritional Status: BMI 25 -29 Overweight  Nutritional Risks: None Diabetes: No  Lab Results  Component Value Date   HGBA1C 6.1 07/07/2023   HGBA1C 6.0 07/02/2022   HGBA1C 6.0 11/06/2021     How often do you need to have someone help you when you read instructions, pamphlets, or other written materials from your doctor or pharmacy?: 1 - Never  Interpreter Needed?: No  Comments: lives with husband Information entered by :: B.Reshma Hoey,LPN   Activities of Daily Living     11/26/2023    3:53 PM  In your present state of health, do you have any difficulty performing the following activities:  Hearing? 1  Vision? 0  Difficulty concentrating or making decisions? 0  Walking or climbing stairs? 0  Dressing or bathing? 0  Doing errands, shopping? 0  Preparing Food and eating ? N  Using the Toilet? N  In the past six months, have you accidently leaked urine? N  Do you have problems with loss of bowel control? N  Managing your Medications? N  Managing your Finances? N  Housekeeping or managing your Housekeeping? N    Patient Care Team: Tower, Laine LABOR, MD as PCP - General Portia Fireman, [place] as Referring Physician (Optometry)  I have updated your Care Teams any recent Medical Services you may have received from other providers in the past year.     Assessment:   This is a routine wellness examination for Sue Conway.  Hearing/Vision screen Hearing Screening - Comments:: Pt says hears ok when ears not roaring or buzzing Vision Screening - Comments:: Pt says her vision is good  Dr Portia   Goals  Addressed             This Visit's Progress    Patient Stated   On track    11/26/23- I will continue to take medications as prescribed.      Patient Stated   On track    11/26/23-Continue current lifestyle      Patient Stated   On track    11/26/23-Exercise more.       Depression Screen     11/26/2023    3:48 PM 07/14/2023   10:04 AM 04/02/2023   11:39 AM 11/25/2022    3:42 PM 07/09/2022   11:10 AM 06/04/2022    7:55 AM 11/06/2021    2:54 PM  PHQ 2/9 Scores  PHQ - 2 Score 2 4 4  0 4 4 3   PHQ- 9 Score 3 14 9  16 14 13     Fall Risk     11/26/2023    3:46 PM 07/14/2023   10:04 AM 04/02/2023   11:38 AM 11/25/2022    3:44 PM 07/09/2022   11:09 AM  Fall Risk   Falls in the past year? 0 0 0 0 0  Number falls in past yr: 0 0 0 0 0  Injury with Fall? 0 0 0 0 0  Risk for fall due to : No Fall Risks No Fall Risks No Fall Risks No Fall Risks No Fall Risks  Follow up Education provided;Falls prevention discussed Falls evaluation completed Falls evaluation completed Falls prevention discussed;Falls evaluation completed Falls evaluation completed    MEDICARE RISK AT HOME:  Medicare Risk at Home Any stairs in or around the home?: No If so, are there any without handrails?: Yes Home free of loose throw rugs in walkways, pet beds, electrical cords, etc?: Yes Adequate lighting in your home to reduce risk of falls?: Yes Life alert?: No Use of a cane, walker  or w/c?: No Grab bars in the bathroom?: No Shower chair or bench in shower?: No Elevated toilet seat or a handicapped toilet?: No  TIMED UP AND GO:  Was the test performed?  No  Cognitive Function: 6CIT completed    05/18/2019    9:09 AM 05/05/2018    9:02 AM 11/08/2015   11:38 AM  MMSE - Mini Mental State Exam  Orientation to time 5 5 5    Orientation to Place 5 5 5    Registration 3 3 3    Attention/ Calculation 5 0 0   Recall 3 3 3    Language- name 2 objects  0 0   Language- repeat 1 1 1   Language- follow 3 step command   3 3   Language- read & follow direction  0 0   Write a sentence  0 0   Copy design  0 0   Total score  20 20      Data saved with a previous flowsheet row definition        11/26/2023    3:56 PM 11/25/2022    3:46 PM 09/19/2021   10:04 AM  6CIT Screen  What Year? 0 points 0 points 0 points  What month? 0 points 0 points 0 points  What time? 0 points 0 points 0 points  Count back from 20 0 points 0 points 0 points  Months in reverse 0 points 0 points 0 points  Repeat phrase 0 points 6 points 0 points  Total Score 0 points 6 points 0 points    Immunizations Immunization History  Administered Date(s) Administered   Fluad Quad(high Dose 65+) 03/02/2019, 03/07/2020   Influenza Inj Mdck Quad Pf 03/02/2019   Influenza, High Dose Seasonal PF 01/24/2016, 02/05/2017, 02/17/2018, 01/03/2022, 01/26/2023   Influenza,inj,Quad PF,6+ Mos 03/03/2014, 04/18/2015   Moderna Covid-19 Fall Seasonal Vaccine 80yrs & older 01/26/2023   PFIZER(Purple Top)SARS-COV-2 Vaccination 07/14/2019, 08/09/2019, 02/22/2020   Pneumococcal Conjugate-13 06/06/2014   Pneumococcal Polysaccharide-23 09/27/2010   Td 12/17/2001   Tdap 11/28/2022   Zoster, Live 01/24/2016    Screening Tests Health Maintenance  Topic Date Due   COVID-19 Vaccine (5 - Pfizer risk 2024-25 season) 06/05/2024 (Originally 07/26/2023)   Zoster Vaccines- Shingrix (1 of 2) 10/10/2024 (Originally 11/21/1964)   INFLUENZA VACCINE  12/18/2023   MAMMOGRAM  08/20/2024   Medicare Annual Wellness (AWV)  11/25/2024   DTaP/Tdap/Td (3 - Td or Tdap) 11/27/2032   Pneumococcal Vaccine: 50+ Years  Completed   DEXA SCAN  Completed   Hepatitis B Vaccines  Aged Out   HPV VACCINES  Aged Out   Meningococcal B Vaccine  Aged Out   Colonoscopy  Discontinued   Hepatitis C Screening  Discontinued    Health Maintenance  There are no preventive care reminders to display for this patient. Health Maintenance Items Addressed: None at this time needed  Additional  Screening:  Vision Screening: Recommended annual ophthalmology exams for early detection of glaucoma and other disorders of the eye. Would you like a referral to an eye doctor? No    Dental Screening: Recommended annual dental exams for proper oral hygiene  Community Resource Referral / Chronic Care Management: CRR required this visit?  No   CCM required this visit?  Appt scheduled with PCP   Plan:    I have personally reviewed and noted the following in the patient's chart:   Medical and social history Use of alcohol, tobacco or illicit drugs  Current medications and supplements including opioid prescriptions.  Patient is not currently taking opioid prescriptions. Functional ability and status Nutritional status Physical activity Advanced directives List of other physicians Hospitalizations, surgeries, and ER visits in previous 12 months Vitals Screenings to include cognitive, depression, and falls Referrals and appointments  In addition, I have reviewed and discussed with patient certain preventive protocols, quality metrics, and best practice recommendations. A written personalized care plan for preventive services as well as general preventive health recommendations were provided to patient.   Erminio LITTIE Saris, LPN   [id number]   After Visit Summary: (MyChart) Due to this being a telephonic visit, the after visit summary with patients personalized plan was offered to patient via MyChart   Notes: Nothing significant to report at this time.

## 2023-11-26 NOTE — Patient Instructions (Signed)
 Sue Conway , Thank you for taking time out of your busy schedule to complete your Annual Wellness Visit with me. I enjoyed our conversation and look forward to speaking with you again next year. I, as well as your care team,  appreciate your ongoing commitment to your health goals. Please review the following plan we discussed and let me know if I can assist you in the future. Your Game plan/ To Do List     Follow up Visits: Next Medicare AWV with our clinical staff: 11/28/24 @ 3:40pm televisit   Have you seen your provider in the last 6 months (3 months if uncontrolled diabetes)? Yes Next Office Visit with your provider: 07/14/24 @ 8:30am physical  Clinician Recommendations:  Aim for 30 minutes of exercise or brisk walking, 6-8 glasses of water, and 5 servings of fruits and vegetables each day.       This is a list of the screening recommended for you and due dates:  Health Maintenance  Topic Date Due   COVID-19 Vaccine (5 - Pfizer risk 2024-25 season) 06/05/2024*   Zoster (Shingles) Vaccine (1 of 2) 10/10/2024*   Flu Shot  12/18/2023   Mammogram  08/20/2024   Medicare Annual Wellness Visit  11/25/2024   DTaP/Tdap/Td vaccine (3 - Td or Tdap) 11/27/2032   Pneumococcal Vaccine for age over 74  Completed   DEXA scan (bone density measurement)  Completed   Hepatitis B Vaccine  Aged Out   HPV Vaccine  Aged Out   Meningitis B Vaccine  Aged Out   Colon Cancer Screening  Discontinued   Hepatitis C Screening  Discontinued  *Topic was postponed. The date shown is not the original due date.    Advanced directives: (Declined) Advance directive discussed with you today. Even though you declined this today, please call our office should you change your mind, and we can give you the proper paperwork for you to fill out. Advance Care Planning is important because it:  [x]  Makes sure you receive the medical care that is consistent with your values, goals, and preferences  [x]  It provides guidance  to your family and loved ones and reduces their decisional burden about whether or not they are making the right decisions based on your wishes.  Follow the link provided in your after visit summary or read over the paperwork we have mailed to you to help you started getting your Advance Directives in place. If you need assistance in completing these, please reach out to us  so that we can help you!

## 2023-12-01 DIAGNOSIS — K080 Exfoliation of teeth due to systemic causes: Secondary | ICD-10-CM | POA: Diagnosis not present

## 2024-04-22 ENCOUNTER — Telehealth: Payer: Self-pay

## 2024-04-22 NOTE — Telephone Encounter (Signed)
 Patient was seen by Cathlyn Seal a dermatologist with her own practice back in 06/25/2023. Am I okay to close the Dermatologist referral that you place to [facility]?

## 2024-04-24 NOTE — Telephone Encounter (Signed)
 Yes, thanks

## 2024-07-14 ENCOUNTER — Encounter: Admitting: Family Medicine

## 2024-11-28 ENCOUNTER — Ambulatory Visit
